# Patient Record
Sex: Female | Born: 1955 | Race: Black or African American | Hispanic: No | Marital: Married | Smoking: Never smoker
Health system: Southern US, Community
[De-identification: ages and names within clinical notes are randomized; demographics above are authoritative.]

## PROBLEM LIST (undated history)

## (undated) DIAGNOSIS — E785 Hyperlipidemia, unspecified: Secondary | ICD-10-CM

## (undated) DIAGNOSIS — G8929 Other chronic pain: Principal | ICD-10-CM

## (undated) DIAGNOSIS — M1990 Unspecified osteoarthritis, unspecified site: Secondary | ICD-10-CM

## (undated) DIAGNOSIS — T7840XA Allergy, unspecified, initial encounter: Secondary | ICD-10-CM

## (undated) DIAGNOSIS — G43909 Migraine, unspecified, not intractable, without status migrainosus: Secondary | ICD-10-CM

## (undated) DIAGNOSIS — H269 Unspecified cataract: Secondary | ICD-10-CM

## (undated) DIAGNOSIS — N201 Calculus of ureter: Secondary | ICD-10-CM

## (undated) DIAGNOSIS — E119 Type 2 diabetes mellitus without complications: Secondary | ICD-10-CM

## (undated) DIAGNOSIS — M12811 Other specific arthropathies, not elsewhere classified, right shoulder: Secondary | ICD-10-CM

## (undated) DIAGNOSIS — M545 Low back pain: Principal | ICD-10-CM

## (undated) DIAGNOSIS — I10 Essential (primary) hypertension: Secondary | ICD-10-CM

## (undated) HISTORY — PX: ROTATOR CUFF REPAIR: SHX139

## (undated) HISTORY — DX: Unspecified cataract: H26.9

## (undated) HISTORY — PX: UMBILICAL HERNIA REPAIR: SHX196

## (undated) HISTORY — DX: Hyperlipidemia, unspecified: E78.5

## (undated) HISTORY — DX: Allergy, unspecified, initial encounter: T78.40XA

## (undated) HISTORY — DX: Other specific arthropathies, not elsewhere classified, right shoulder: M12.811

## (undated) HISTORY — DX: Low back pain: M54.5

## (undated) HISTORY — PX: CYST REMOVAL HAND: SHX6279

## (undated) HISTORY — DX: Other chronic pain: G89.29

## (undated) HISTORY — PX: TONSILLECTOMY: SUR1361

## (undated) HISTORY — DX: Unspecified osteoarthritis, unspecified site: M19.90

---

## 2000-03-18 HISTORY — PX: ABDOMINAL HYSTERECTOMY: SHX81

## 2000-03-28 ENCOUNTER — Other Ambulatory Visit: Admission: RE | Admit: 2000-03-28 | Discharge: 2000-03-28 | Payer: Self-pay | Admitting: Obstetrics and Gynecology

## 2001-01-27 ENCOUNTER — Encounter: Payer: Self-pay | Admitting: Internal Medicine

## 2001-01-27 ENCOUNTER — Ambulatory Visit (HOSPITAL_COMMUNITY): Admission: RE | Admit: 2001-01-27 | Discharge: 2001-01-27 | Payer: Self-pay | Admitting: Internal Medicine

## 2001-03-17 ENCOUNTER — Ambulatory Visit (HOSPITAL_COMMUNITY): Admission: RE | Admit: 2001-03-17 | Discharge: 2001-03-17 | Payer: Self-pay | Admitting: Internal Medicine

## 2001-03-17 ENCOUNTER — Encounter: Payer: Self-pay | Admitting: Obstetrics and Gynecology

## 2003-01-07 ENCOUNTER — Encounter: Payer: Self-pay | Admitting: Obstetrics and Gynecology

## 2003-01-07 ENCOUNTER — Ambulatory Visit (HOSPITAL_COMMUNITY): Admission: RE | Admit: 2003-01-07 | Discharge: 2003-01-07 | Payer: Self-pay | Admitting: Obstetrics and Gynecology

## 2003-07-26 ENCOUNTER — Ambulatory Visit (HOSPITAL_COMMUNITY): Admission: RE | Admit: 2003-07-26 | Discharge: 2003-07-26 | Payer: Self-pay | Admitting: Internal Medicine

## 2004-03-13 ENCOUNTER — Ambulatory Visit (HOSPITAL_COMMUNITY): Admission: RE | Admit: 2004-03-13 | Discharge: 2004-03-13 | Payer: Self-pay | Admitting: Internal Medicine

## 2004-03-16 ENCOUNTER — Ambulatory Visit (HOSPITAL_COMMUNITY): Admission: RE | Admit: 2004-03-16 | Discharge: 2004-03-16 | Payer: Self-pay | Admitting: Obstetrics and Gynecology

## 2005-08-02 ENCOUNTER — Ambulatory Visit (HOSPITAL_COMMUNITY): Admission: RE | Admit: 2005-08-02 | Discharge: 2005-08-02 | Payer: Self-pay | Admitting: Obstetrics and Gynecology

## 2006-03-17 ENCOUNTER — Ambulatory Visit (HOSPITAL_COMMUNITY): Admission: RE | Admit: 2006-03-17 | Discharge: 2006-03-17 | Payer: Self-pay | Admitting: Internal Medicine

## 2007-10-23 ENCOUNTER — Ambulatory Visit (HOSPITAL_COMMUNITY): Admission: RE | Admit: 2007-10-23 | Discharge: 2007-10-23 | Payer: Self-pay | Admitting: Internal Medicine

## 2008-02-10 ENCOUNTER — Ambulatory Visit (HOSPITAL_COMMUNITY): Admission: RE | Admit: 2008-02-10 | Discharge: 2008-02-10 | Payer: Self-pay | Admitting: Obstetrics and Gynecology

## 2010-04-11 ENCOUNTER — Encounter: Payer: Self-pay | Admitting: Gastroenterology

## 2010-04-19 NOTE — Letter (Signed)
Summary: Colonoscopy Letter  [facility]  [address] [place], [place] [address]   Phone: [phone]  Fax: [phone]      April 11, 2010 MRN: [id number]   [facility] [address] [place], [place]  [address]   Dear Ms. MILLER,   According to your medical record, it is time for you to schedule a Colonoscopy. The American Cancer Society recommends this procedure as a method to detect early colon cancer. Patients with a family history of colon cancer, or a personal history of colon polyps or inflammatory bowel disease are at increased risk.  This letter has been generated based on the recommendations made at the time of your procedure. If you feel that in your particular situation this may no longer apply, please contact our office.  Please call our office at [phone] to schedule this appointment or to update your records at your earliest convenience.  Thank you for cooperating with [place] to provide you with the very best care possible.   Sincerely,  Judie Petit T. Russella Dar, M.D.  [facility] [phone]

## 2011-12-02 ENCOUNTER — Encounter: Payer: Self-pay | Admitting: Gastroenterology

## 2012-08-09 ENCOUNTER — Emergency Department (HOSPITAL_COMMUNITY)
Admission: EM | Admit: 2012-08-09 | Discharge: 2012-08-09 | Disposition: A | Discharge status: Home / Self Care | Payer: BC Managed Care – PPO | Attending: Emergency Medicine | Admitting: Emergency Medicine

## 2012-08-09 ENCOUNTER — Emergency Department (HOSPITAL_COMMUNITY): Payer: BC Managed Care – PPO

## 2012-08-09 ENCOUNTER — Encounter (HOSPITAL_COMMUNITY): Payer: Self-pay | Admitting: Emergency Medicine

## 2012-08-09 DIAGNOSIS — R062 Wheezing: Secondary | ICD-10-CM | POA: Insufficient documentation

## 2012-08-09 DIAGNOSIS — J40 Bronchitis, not specified as acute or chronic: Secondary | ICD-10-CM

## 2012-08-09 DIAGNOSIS — J029 Acute pharyngitis, unspecified: Secondary | ICD-10-CM | POA: Insufficient documentation

## 2012-08-09 DIAGNOSIS — J3489 Other specified disorders of nose and nasal sinuses: Secondary | ICD-10-CM | POA: Insufficient documentation

## 2012-08-09 DIAGNOSIS — R6883 Chills (without fever): Secondary | ICD-10-CM | POA: Insufficient documentation

## 2012-08-09 DIAGNOSIS — Z8679 Personal history of other diseases of the circulatory system: Secondary | ICD-10-CM | POA: Insufficient documentation

## 2012-08-09 DIAGNOSIS — R6889 Other general symptoms and signs: Secondary | ICD-10-CM | POA: Insufficient documentation

## 2012-08-09 DIAGNOSIS — I10 Essential (primary) hypertension: Secondary | ICD-10-CM | POA: Insufficient documentation

## 2012-08-09 DIAGNOSIS — G479 Sleep disorder, unspecified: Secondary | ICD-10-CM | POA: Insufficient documentation

## 2012-08-09 DIAGNOSIS — J209 Acute bronchitis, unspecified: Secondary | ICD-10-CM | POA: Insufficient documentation

## 2012-08-09 HISTORY — DX: Migraine, unspecified, not intractable, without status migrainosus: G43.909

## 2012-08-09 HISTORY — DX: Essential (primary) hypertension: I10

## 2012-08-09 MED ORDER — ALBUTEROL SULFATE HFA 108 (90 BASE) MCG/ACT IN AERS
2.0000 | INHALATION_SPRAY | RESPIRATORY_TRACT | Status: DC | PRN
  Administered 2012-08-09: 2 via RESPIRATORY_TRACT
  Filled 2012-08-09: qty 6.7

## 2012-08-09 MED ORDER — GUAIFENESIN-CODEINE 100-10 MG/5ML PO SYRP: 5.0000 mL | ORAL_SOLUTION | Freq: Three times a day (TID) | ORAL | Status: DC | PRN

## 2012-08-09 MED ORDER — AZITHROMYCIN 250 MG PO TABS: 250.0000 mg | ORAL_TABLET | Freq: Every day | ORAL | Status: DC

## 2012-08-09 NOTE — ED Notes (Signed)
C/o scratchy throat x 5 days ago. Then started coughing and c/o wheezing. Denies prod cough. Nad. No resp distres noted. Dry cough noted.

## 2012-08-09 NOTE — ED Provider Notes (Signed)
History     CSN: [id number]  Arrival date & time 08/09/12  0755   First MD Initiated Contact with Patient 08/09/12 [phone]      Chief Complaint  Patient presents with  . Cough    (Consider location/radiation/quality/duration/timing/severity/associated sxs/prior treatment) Patient is a 57 y.o. female presenting with cough. The history is provided by the patient.  Cough Cough characteristics:  Non-productive and hacking Duration:  5 days Timing:  Sporadic Progression:  Worsening Chronicity:  New Smoker: no   Relieved by:  Nothing Worsened by:  Lying down Ineffective treatments:  Decongestant Associated symptoms: chills, sore throat and wheezing   Associated symptoms: no chest pain, no fever, no headaches, no myalgias and no rash    Glenda White is a 57 y.o. female who presents to the ED with a cough and scratchy throat. She states the symptoms are worse at night and she has trouble sleeping. She feel like she is wheezing. She has tried OTC medication without relief. Thought at first it was allergies but symptoms have continued despite medications.   Past Medical History  Diagnosis Date  . Hypertension   . Migraines     Past Surgical History  Procedure Laterality Date  . Abdominal hysterectomy    . Hernia repair    . Cyst removal hand    . Rotator cuff repair Left     History reviewed. No pertinent family history.  History  Substance Use Topics  . Smoking status: Never Smoker   . Smokeless tobacco: Not on file  . Alcohol Use: No    OB History   Grav Para Term Preterm Abortions TAB SAB Ect Mult Living                  Review of Systems  Constitutional: Positive for chills. Negative for fever.  HENT: Positive for sore throat.   Respiratory: Positive for cough and wheezing.   Cardiovascular: Negative for chest pain.  Gastrointestinal: Negative for nausea, vomiting, abdominal pain and diarrhea.  Musculoskeletal: Negative for myalgias.  Skin: Negative for  rash.  Neurological: Negative for light-headedness and headaches.  Psychiatric/Behavioral: The patient is not nervous/anxious.     Allergies  Review of patient's allergies indicates no known allergies.  Home Medications  No current outpatient prescriptions on file.  BP 125/67  Pulse 89  Temp(Src) 98.2 F (36.8 C) (Oral)  Resp 16  SpO2 98%  Physical Exam  Nursing note and vitals reviewed. Constitutional: She is oriented to person, place, and time. She appears well-developed and well-nourished. No distress.  HENT:  Head: Normocephalic.  Right Ear: Tympanic membrane normal.  Left Ear: Tympanic membrane normal.  Nose: Rhinorrhea present.  Mouth/Throat: Uvula is midline and mucous membranes are normal. Posterior oropharyngeal erythema present.  Eyes: EOM are normal.  Neck: Neck supple.  Cardiovascular: Normal rate and regular rhythm.   Pulmonary/Chest: Effort normal. She has decreased breath sounds in the right lower field. Wheezes: occasional.  Prolonged expirations  Abdominal: Soft. Bowel sounds are normal. There is no tenderness.  Musculoskeletal: Normal range of motion.  Neurological: She is alert and oriented to person, place, and time. No cranial nerve deficit.  Skin: Skin is warm and dry.  Psychiatric: She has a normal mood and affect. Her behavior is normal. Judgment and thought content normal.    ED Course  Procedures (including critical care time) Dg Chest 2 View  08/09/2012   *RADIOLOGY REPORT*  Clinical Data: Cough, congestion, sore throat  CHEST - 2 VIEW  Comparison: 10/23/2007; 03/17/2006  Findings: Grossly unchanged borderline enlarged cardiac silhouette. Normal mediastinal contours.  No focal parenchymal opacities.  No pleural effusion or pneumothorax.  No evidence of edema.  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  IMPRESSION: No acute cardiopulmonary disease.  Specifically, no evidence of pneumonia.   Original Report Authenticated By: Tacey Ruiz, MD   MDM  57 y.o. female with cough and wheezing. Will treat symptoms and she will follow up with Dr. Ouida Sills. Patient stable for discharge home without any immediate complications. No respiratory distress. I have reviewed this patient's vital signs, nurses notes, appropriate labs and imaging.  I have discussed findings and plan of care with the patient and she voices understanding.    Medication List    TAKE these medications       azithromycin 250 MG tablet  Commonly known as:  ZITHROMAX  Take 1 tablet (250 mg total) by mouth daily. Take first 2 tablets together, then 1 every day until finished.     guaiFENesin-codeine 100-10 MG/5ML syrup  Commonly known as:  ROBITUSSIN AC  Take 5 mLs by mouth 3 (three) times daily as needed for cough.               [address] [place], [place] 08/09/12 [phone]

## 2012-08-09 NOTE — ED Provider Notes (Signed)
Medical screening examination/treatment/procedure(s) were performed by non-physician practitioner and as supervising physician I was immediately available for consultation/collaboration.   Dione Booze, MD 08/09/12 [phone]

## 2012-11-27 ENCOUNTER — Ambulatory Visit (HOSPITAL_COMMUNITY)
Admission: RE | Admit: 2012-11-27 | Discharge: 2012-11-27 | Disposition: A | Discharge status: Home / Self Care | Payer: BC Managed Care – PPO | Source: Ambulatory Visit | Attending: Internal Medicine | Admitting: Internal Medicine

## 2012-11-27 ENCOUNTER — Other Ambulatory Visit (HOSPITAL_COMMUNITY): Payer: Self-pay | Admitting: Internal Medicine

## 2012-11-27 DIAGNOSIS — M545 Low back pain: Secondary | ICD-10-CM

## 2012-11-27 DIAGNOSIS — R0781 Pleurodynia: Secondary | ICD-10-CM

## 2012-11-27 DIAGNOSIS — M5459 Other low back pain: Secondary | ICD-10-CM

## 2012-11-27 DIAGNOSIS — R079 Chest pain, unspecified: Secondary | ICD-10-CM | POA: Insufficient documentation

## 2012-11-27 DIAGNOSIS — M549 Dorsalgia, unspecified: Secondary | ICD-10-CM | POA: Insufficient documentation

## 2012-11-27 DIAGNOSIS — IMO0002 Reserved for concepts with insufficient information to code with codable children: Secondary | ICD-10-CM | POA: Insufficient documentation

## 2013-05-06 ENCOUNTER — Other Ambulatory Visit (HOSPITAL_COMMUNITY): Payer: Self-pay | Admitting: Obstetrics and Gynecology

## 2013-05-06 DIAGNOSIS — Z1231 Encounter for screening mammogram for malignant neoplasm of breast: Secondary | ICD-10-CM

## 2013-05-07 ENCOUNTER — Ambulatory Visit (HOSPITAL_COMMUNITY)
Admission: RE | Admit: 2013-05-07 | Discharge: 2013-05-07 | Disposition: A | Discharge status: Home / Self Care | Payer: BC Managed Care – PPO | Source: Ambulatory Visit | Attending: Obstetrics and Gynecology | Admitting: Obstetrics and Gynecology

## 2013-05-07 DIAGNOSIS — Z1231 Encounter for screening mammogram for malignant neoplasm of breast: Secondary | ICD-10-CM | POA: Insufficient documentation

## 2013-05-14 ENCOUNTER — Encounter (HOSPITAL_COMMUNITY): Payer: Self-pay | Admitting: Emergency Medicine

## 2013-05-14 ENCOUNTER — Emergency Department (HOSPITAL_COMMUNITY): Payer: BC Managed Care – PPO

## 2013-05-14 ENCOUNTER — Emergency Department (HOSPITAL_COMMUNITY)
Admission: EM | Admit: 2013-05-14 | Discharge: 2013-05-14 | Disposition: A | Discharge status: Home / Self Care | Payer: BC Managed Care – PPO | Attending: Emergency Medicine | Admitting: Emergency Medicine

## 2013-05-14 DIAGNOSIS — Z792 Long term (current) use of antibiotics: Secondary | ICD-10-CM | POA: Insufficient documentation

## 2013-05-14 DIAGNOSIS — M25451 Effusion, right hip: Secondary | ICD-10-CM

## 2013-05-14 DIAGNOSIS — M25469 Effusion, unspecified knee: Secondary | ICD-10-CM | POA: Insufficient documentation

## 2013-05-14 DIAGNOSIS — I10 Essential (primary) hypertension: Secondary | ICD-10-CM | POA: Insufficient documentation

## 2013-05-14 DIAGNOSIS — M25569 Pain in unspecified knee: Secondary | ICD-10-CM | POA: Insufficient documentation

## 2013-05-14 DIAGNOSIS — M1611 Unilateral primary osteoarthritis, right hip: Secondary | ICD-10-CM

## 2013-05-14 LAB — CBC WITH DIFFERENTIAL/PLATELET
Basophils Absolute: 0 10*3/uL (ref 0.0–0.1)
Basophils Relative: 0 % (ref 0–1)
Eosinophils Absolute: 0.3 10*3/uL (ref 0.0–0.7)
Eosinophils Relative: 4 % (ref 0–5)
HCT: 40.5 % (ref 36.0–46.0)
Hemoglobin: 13.6 g/dL (ref 12.0–15.0)
Lymphocytes Relative: 25 % (ref 12–46)
Lymphs Abs: 2.1 10*3/uL (ref 0.7–4.0)
MCH: 30.8 pg (ref 26.0–34.0)
MCHC: 33.6 g/dL (ref 30.0–36.0)
MCV: 91.8 fL (ref 78.0–100.0)
Monocytes Absolute: 0.5 10*3/uL (ref 0.1–1.0)
Monocytes Relative: 6 % (ref 3–12)
Neutro Abs: 5.4 10*3/uL (ref 1.7–7.7)
Neutrophils Relative %: 65 % (ref 43–77)
Platelets: 181 10*3/uL (ref 150–400)
RBC: 4.41 MIL/uL (ref 3.87–5.11)
RDW: 14.6 % (ref 11.5–15.5)
WBC: 8.2 10*3/uL (ref 4.0–10.5)

## 2013-05-14 LAB — BASIC METABOLIC PANEL
BUN: 16 mg/dL (ref 6–23)
CO2: 24 mEq/L (ref 19–32)
Calcium: 8.6 mg/dL (ref 8.4–10.5)
Chloride: 105 mEq/L (ref 96–112)
Creatinine, Ser: 0.96 mg/dL (ref 0.50–1.10)
GFR calc Af Amer: 74 mL/min — ABNORMAL LOW (ref >90)
GFR calc non Af Amer: 64 mL/min — ABNORMAL LOW (ref >90)
Glucose, Bld: 115 mg/dL — ABNORMAL HIGH (ref 70–99)
Potassium: 3.1 mEq/L — ABNORMAL LOW (ref 3.7–5.3)
Sodium: 141 mEq/L (ref 137–147)

## 2013-05-14 LAB — D-DIMER, QUANTITATIVE: D-Dimer, Quant: 0.31 ug/mL-FEU (ref 0.00–0.48)

## 2013-05-14 MED ORDER — PREDNISONE 50 MG PO TABS
60.0000 mg | ORAL_TABLET | Freq: Once | ORAL | Status: AC
  Administered 2013-05-14: 60 mg via ORAL
  Filled 2013-05-14 (×2): qty 1

## 2013-05-14 MED ORDER — OXYCODONE-ACETAMINOPHEN 5-325 MG PO TABS: 1.0000 | ORAL_TABLET | ORAL | Status: DC | PRN

## 2013-05-14 MED ORDER — POTASSIUM CHLORIDE CRYS ER 20 MEQ PO TBCR
40.0000 meq | EXTENDED_RELEASE_TABLET | Freq: Once | ORAL | Status: AC
  Administered 2013-05-14: 40 meq via ORAL
  Filled 2013-05-14: qty 2

## 2013-05-14 MED ORDER — HYDROMORPHONE HCL PF 1 MG/ML IJ SOLN
1.0000 mg | Freq: Once | INTRAMUSCULAR | Status: AC
  Administered 2013-05-14: 1 mg via INTRAVENOUS
  Filled 2013-05-14: qty 1

## 2013-05-14 MED ORDER — PREDNISONE 10 MG PO TABS: 60.0000 mg | ORAL_TABLET | Freq: Every day | ORAL | Status: DC

## 2013-05-14 MED ORDER — ONDANSETRON HCL 4 MG/2ML IJ SOLN
4.0000 mg | Freq: Once | INTRAMUSCULAR | Status: AC
  Administered 2013-05-14: 4 mg via INTRAVENOUS
  Filled 2013-05-14: qty 2

## 2013-05-14 MED ORDER — OXYCODONE-ACETAMINOPHEN 5-325 MG PO TABS
1.0000 | ORAL_TABLET | Freq: Once | ORAL | Status: AC
  Administered 2013-05-14: 1 via ORAL
  Filled 2013-05-14: qty 1

## 2013-05-14 MED ORDER — POTASSIUM CHLORIDE 10 MEQ/100ML IV SOLN
10.0000 meq | Freq: Once | INTRAVENOUS | Status: AC
  Administered 2013-05-14: 10 meq via INTRAVENOUS
  Filled 2013-05-14: qty 100

## 2013-05-14 MED ORDER — IBUPROFEN 600 MG PO TABS: 600.0000 mg | ORAL_TABLET | Freq: Three times a day (TID) | ORAL | Status: DC | PRN

## 2013-05-14 NOTE — ED Provider Notes (Signed)
CSN: [id number]     Arrival date & time 05/14/13  0445 History   First MD Initiated Contact with Patient 05/14/13 [phone]     Chief Complaint  Patient presents with  . Groin Pain     (Consider location/radiation/quality/duration/timing/severity/associated sxs/prior Treatment) The history is provided by the patient.   58 year old female had onset last evening of pain in the proximal right thigh. Pain is worse with movement or weightbearing. She states it is sharp and severe and she rates at 10/10. She denies any trauma, and denies any unusual activity recently. She tried applying heat and muscle cream with no relief. She has not had pain like this before. She denies numbness or tingling. Pain does not radiate to the abdomen or back.  Past Medical History  Diagnosis Date  . Hypertension   . Migraines    Past Surgical History  Procedure Laterality Date  . Abdominal hysterectomy    . Hernia repair    . Cyst removal hand    . Rotator cuff repair Left    No family history on file. History  Substance Use Topics  . Smoking status: Never Smoker   . Smokeless tobacco: Not on file  . Alcohol Use: No   OB History   Grav Para Term Preterm Abortions TAB SAB Ect Mult Living                 Review of Systems  All other systems reviewed and are negative.      Allergies  Review of patient's allergies indicates no known allergies.  Home Medications   Current Outpatient Rx  Name  Route  Sig  Dispense  Refill  . azithromycin (ZITHROMAX) 250 MG tablet   Oral   Take 1 tablet (250 mg total) by mouth daily. Take first 2 tablets together, then 1 every day until finished.   6 tablet   0   . guaiFENesin-codeine (ROBITUSSIN AC) 100-10 MG/5ML syrup   Oral   Take 5 mLs by mouth 3 (three) times daily as needed for cough.   120 mL   0    BP 118/71  Pulse 85  Temp(Src) 98.1 F (36.7 C) (Oral)  Resp 16  Ht 5' (1.524 m)  Wt 225 lb (102.059 kg)  BMI 43.94 kg/m2  SpO2 98% Physical  Exam  Nursing note and vitals reviewed.  58 year old female, resting comfortably and in no acute distress. Vital signs are normal. Oxygen saturation is 98%, which is normal. Head is normocephalic and atraumatic. PERRLA, EOMI. Oropharynx is clear. Neck is nontender and supple without adenopathy or JVD. Back is nontender and there is no CVA tenderness. Lungs are clear without rales, wheezes, or rhonchi. Chest is nontender. Heart has regular rate and rhythm without murmur. Abdomen is soft, flat, nontender without masses or hepatosplenomegaly and peristalsis is normoactive. Extremities : There is no swelling or deformity of the right thigh. There is tenderness to palpation anteriorly and laterally on the proximal thigh and hip. There is severe pain with external rotation and flexion. Distal neurovascular exam is intact with strong pulses, prompt capillary refill, normal sensation. No other extremity abnormalities are seen. Skin is warm and dry without rash. Neurologic: Mental status is normal, cranial nerves are intact, there are no motor or sensory deficits.  ED Course  Procedures (including critical care time) Labs Review Results for orders placed during the hospital encounter of 05/14/13  CBC WITH DIFFERENTIAL      Result Value Ref Range  WBC 8.2  4.0 - 10.5 K/uL   RBC 4.41  3.87 - 5.11 MIL/uL   Hemoglobin 13.6  12.0 - 15.0 g/dL   HCT 91.4  78.2 - 95.6 %   MCV 91.8  78.0 - 100.0 fL   MCH 30.8  26.0 - 34.0 pg   MCHC 33.6  30.0 - 36.0 g/dL   RDW 21.3  08.6 - 57.8 %   Platelets 181  150 - 400 K/uL   Neutrophils Relative % 65  43 - 77 %   Neutro Abs 5.4  1.7 - 7.7 K/uL   Lymphocytes Relative 25  12 - 46 %   Lymphs Abs 2.1  0.7 - 4.0 K/uL   Monocytes Relative 6  3 - 12 %   Monocytes Absolute 0.5  0.1 - 1.0 K/uL   Eosinophils Relative 4  0 - 5 %   Eosinophils Absolute 0.3  0.0 - 0.7 K/uL   Basophils Relative 0  0 - 1 %   Basophils Absolute 0.0  0.0 - 0.1 K/uL  BASIC METABOLIC PANEL       Result Value Ref Range   Sodium 141  137 - 147 mEq/L   Potassium 3.1 (*) 3.7 - 5.3 mEq/L   Chloride 105  96 - 112 mEq/L   CO2 24  19 - 32 mEq/L   Glucose, Bld 115 (*) 70 - 99 mg/dL   BUN 16  6 - 23 mg/dL   Creatinine, Ser 4.69  0.50 - 1.10 mg/dL   Calcium 8.6  8.4 - 62.9 mg/dL   GFR calc non Af Amer 64 (*) >90 mL/min   GFR calc Af Amer 74 (*) >90 mL/min  D-DIMER, QUANTITATIVE      Result Value Ref Range   D-Dimer, Quant 0.31  0.00 - 0.48 ug/mL-FEU   Imaging Review Dg Hip Complete Right  05/14/2013   CLINICAL DATA:  Right hip pain, with pain on bearing weight.  EXAM: RIGHT HIP - COMPLETE 2+ VIEW  COMPARISON:  Right chest radiographs performed 03/13/2004  FINDINGS: There is no evidence of fracture or dislocation. Both femoral heads are seated normally within their respective acetabula. The proximal right femur appears intact. No significant degenerative change is appreciated. The sacroiliac joints are unremarkable in appearance.  The visualized bowel gas pattern is grossly unremarkable in appearance.  IMPRESSION: No evidence of fracture or dislocation.   Electronically Signed   By: Roanna Raider M.D.   On: 05/14/2013 05:50   Ct Hip Right Wo Contrast  05/14/2013   CLINICAL DATA:  Severe pain with standing.  EXAM: CT OF THE RIGHT HIP WITHOUT CONTRAST  TECHNIQUE: Multidetector CT imaging was performed according to the standard protocol. Multiplanar CT image reconstructions were also generated.  COMPARISON:  Radiography same day and 03/13/2004  FINDINGS: There is mild joint space narrowing. There are circumferential acetabular osteophytes. The osteophyte is discontinuous along the superior anterior corner our. This is often seen as a chronic characteristic of the osteophytes, but 1 could not rule out an osteophytic fracture. I do not think that is particularly likely. MRI could assess for marrow edema in that region. Surrounding soft tissues do not show any acute finding. There is no visible  joint effusion. Other bones of the right hemipelvis are unremarkable.  IMPRESSION: Probably, there is no acute bony finding. 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 There is some discontinuity of the osteophyte along the anterior superior margin. This can be seen chronically with acetabular osteophytes.  However, with CT, one cannot completely exclude the possibility of an acetabular osteophyte fracture. MRI would be able to do that if there is concern about that possibility. Otherwise, could the pain relate to the osteoarthritis?   Electronically Signed   By: Paulina Fusi M.D.   On: 05/14/2013 07:31   Images viewed by me.  MDM   Final diagnoses:  Pain in right hip  Hypokalemia    Right thigh pain of uncertain cause. X-ray of the obtained and screening labs obtained. Old records are reviewed and there are no relevant past visits.  Workup is negative. D-dimer is obtained to rule out DVT and has come back normal. Hip x-ray appears normal. Incidental finding of hypokalemia is made in she is given both oral and intravenous potassium. Following intravenous hydromorphone, she got some reasonable relief of pain but as soon as she sat up, severe pain recurred. She was reexamined and continues to have severe pain with external rotation. She is afebrile and WBC is normal which would argue strongly against a septic joint. He did not have a good explanation for her pain. She'll be sent for CT scan.  CT shows questionable fracture of an osteophyte. She'll be sent for MRI to clarify this. She is still complaining of severe pain with any movement. Case is signed out to Dr. Patria Mane.  Dione Booze, MD 05/14/13 [phone]

## 2013-05-14 NOTE — ED Notes (Signed)
Pain in right groin area that is worse with any movement of weight bearing of right leg.  Pt denies straining or pulling a muscle

## 2013-05-14 NOTE — ED Provider Notes (Signed)
9:26 AM Pt feels better at this time. Afebrile. Normal WBC count. Arthritis of right hip. Small joint effusion of right hip. No recent procedures or dental cleanings. I spoke with her orthopedist Dr Hilda Lias who will see in the office in 4 days. He recommends prednisone and pain medicine. Pt given strict return precautions including developing fever or worsening pain. My suspicion for spontaneous septic joint is low at this time.   Dg Hip Complete Right  05/14/2013   CLINICAL DATA:  Right hip pain, with pain on bearing weight.  EXAM: RIGHT HIP - COMPLETE 2+ VIEW  COMPARISON:  Right chest radiographs performed 03/13/2004  FINDINGS: There is no evidence of fracture or dislocation. Both femoral heads are seated normally within their respective acetabula. The proximal right femur appears intact. No significant degenerative change is appreciated. The sacroiliac joints are unremarkable in appearance.  The visualized bowel gas pattern is grossly unremarkable in appearance.  IMPRESSION: No evidence of fracture or dislocation.   Electronically Signed   By: Roanna Raider M.D.   On: 05/14/2013 05:50   Ct Hip Right Wo Contrast  05/14/2013   CLINICAL DATA:  Severe pain with standing.  EXAM: CT OF THE RIGHT HIP WITHOUT CONTRAST  TECHNIQUE: Multidetector CT imaging was performed according to the standard protocol. Multiplanar CT image reconstructions were also generated.  COMPARISON:  Radiography same day and 03/13/2004  FINDINGS: There is mild joint space narrowing. There are circumferential acetabular osteophytes. The osteophyte is discontinuous along the superior anterior corner our. This is often seen as a chronic characteristic of the osteophytes, but 1 could not rule out an osteophytic fracture. I do not think that is particularly likely. MRI could assess for marrow edema in that region. Surrounding soft tissues do not show any acute finding. There is no visible joint effusion. Other bones of the right hemipelvis are  unremarkable.  IMPRESSION: Probably, there is no acute bony finding. 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 There is some discontinuity of the osteophyte along the anterior superior margin. This can be seen chronically with acetabular osteophytes. However, with CT, one cannot completely exclude the possibility of an acetabular osteophyte fracture. MRI would be able to do that if there is concern about that possibility. Otherwise, could the pain relate to the osteoarthritis?   Electronically Signed   By: Paulina Fusi M.D.   On: 05/14/2013 07:31   Mr Hip Right Wo Contrast  05/14/2013   CLINICAL DATA:  Severe right hip pain.  EXAM: MRI OF THE RIGHT HIP WITHOUT CONTRAST  TECHNIQUE: Multiplanar, multisequence MR imaging was performed. No intravenous contrast was administered.  COMPARISON:  CT scan and radiographs dated 05/14/2013  FINDINGS: There is a right hip joint effusion. There is mild to moderate osteoarthritis of the right hip with circumferential osteophytes on the acetabulum and minimal osteophytes on the femoral head. There is diffuse narrowing of the joint space. There is no fracture or mass lesion.  There are focal degenerative changes of the distal right gluteus medius and minimus tendons at the insertion on the greater trochanter.  There is no adenopathy. The muscles are symmetrical and normal in the pelvis and hips. Uterus has been removed. There is a 12 mm left labial Bartholin gland duct cyst.  IMPRESSION: Right hip joint effusion consistent with nonspecific synovitis.  Mild to moderate right hip arthritis.  No fracture or mass lesion.   Electronically Signed   By: Geanie Cooley M.D.   On: 05/14/2013 08:49  Mm Digital Screening Bilateral  05/10/2013   CLINICAL DATA:  Screening.  EXAM: DIGITAL SCREENING BILATERAL MAMMOGRAM WITH CAD  COMPARISON:  Previous exam(s).  ACR Breast Density Category c: The breast tissue is heterogeneously dense, which may  obscure small masses.  FINDINGS: There are no findings suspicious for malignancy. Images were processed with CAD.  IMPRESSION: No mammographic evidence of malignancy. A result letter of this screening mammogram will be mailed directly to the patient.  RECOMMENDATION: Screening mammogram in one year. (Code:SM-B-01Y)  BI-RADS CATEGORY  1: Negative.   Electronically Signed   By: Sherian ReinWei-Chen  Lin M.D.   On: 05/10/2013 08:06    Lyanne CoKevin M Neenah Canter, MD 05/14/13 [phone]

## 2013-05-14 NOTE — ED Notes (Signed)
Pt alert & oriented x4, stable gait. Patient given discharge instructions, paperwork & prescription(s). Patient  instructed to stop at the registration desk to finish any additional paperwork. Patient verbalized understanding. Pt left department w/ no further questions. 

## 2014-08-31 ENCOUNTER — Other Ambulatory Visit (HOSPITAL_COMMUNITY): Payer: Self-pay | Admitting: Internal Medicine

## 2014-08-31 DIAGNOSIS — M5416 Radiculopathy, lumbar region: Secondary | ICD-10-CM

## 2014-09-02 ENCOUNTER — Ambulatory Visit
Admission: RE | Admit: 2014-09-02 | Discharge: 2014-09-02 | Disposition: A | Discharge status: Home / Self Care | Payer: No Typology Code available for payment source | Source: Ambulatory Visit | Attending: Internal Medicine | Admitting: Internal Medicine

## 2014-09-02 ENCOUNTER — Ambulatory Visit (HOSPITAL_COMMUNITY): Payer: Self-pay

## 2014-09-02 DIAGNOSIS — M5416 Radiculopathy, lumbar region: Secondary | ICD-10-CM

## 2014-09-16 ENCOUNTER — Ambulatory Visit (INDEPENDENT_AMBULATORY_CARE_PROVIDER_SITE_OTHER): Payer: Self-pay | Admitting: Neurology

## 2014-09-16 ENCOUNTER — Encounter: Payer: Self-pay | Admitting: Neurology

## 2014-09-16 VITALS — BP 124/73 | HR 71 | Ht 60.0 in | Wt 222.0 lb

## 2014-09-16 DIAGNOSIS — G8929 Other chronic pain: Secondary | ICD-10-CM | POA: Insufficient documentation

## 2014-09-16 DIAGNOSIS — M545 Low back pain, unspecified: Secondary | ICD-10-CM

## 2014-09-16 DIAGNOSIS — G43009 Migraine without aura, not intractable, without status migrainosus: Secondary | ICD-10-CM | POA: Insufficient documentation

## 2014-09-16 HISTORY — DX: Low back pain, unspecified: M54.50

## 2014-09-16 MED ORDER — GABAPENTIN 100 MG PO CAPS: ORAL_CAPSULE | ORAL | Status: DC

## 2014-09-16 NOTE — Patient Instructions (Addendum)
   We will start a medication called gabapentin for the low back pain, if the medication is well tolerated, but not effective, please call our office and we will increase the dose. We will get she set up for an epidural steroid injection for the back. If this is helpful, this can be repeated in the future. Seeking out chiropractic therapy may be of some benefit. In the long run, weight loss will help the back pain.  Back Exercises Back exercises help treat and prevent back injuries. The goal is to increase your strength in your belly (abdominal) and back muscles. These exercises can also help with flexibility. Start these exercises when told by your doctor. HOME CARE Back exercises include: Pelvic Tilt.  Lie on your back with your knees bent. Tilt your pelvis until the lower part of your back is against the floor. Hold this position 5 to 10 sec. Repeat this exercise 5 to 10 times. Knee to Chest.  Pull 1 knee up against your chest and hold for 20 to 30 seconds. Repeat this with the other knee. This may be done with the other leg straight or bent, whichever feels better. Then, pull both knees up against your chest. Sit-Ups or Curl-Ups.  Bend your knees 90 degrees. Start with tilting your pelvis, and do a partial, slow sit-up. Only lift your upper half 30 to 45 degrees off the floor. Take at least 2 to 3 seonds for each sit-up. Do not do sit-ups with your knees out straight. If partial sit-ups are difficult, simply do the above but with only tightening your belly (abdominal) muscles and holding it as told. Hip-Lift.  Lie on your back with your knees flexed 90 degrees. Push down with your feet and shoulders as you raise your hips 2 inches off the floor. Hold for 10 seconds, repeat 5 to 10 times. Back Arches.  Lie on your stomach. Prop yourself up on bent elbows. Slowly press on your hands, causing an arch in your low back. Repeat 3 to 5 times. Shoulder-Lifts.  Lie face down with arms beside your  body. Keep hips and belly pressed to floor as you slowly lift your head and shoulders off the floor. Do not overdo your exercises. Be careful in the beginning. Exercises may cause you some mild back discomfort. If the pain lasts for more than 15 minutes, stop the exercises until you see your doctor. Improvement with exercise for back problems is slow.  Document Released: 04/06/2010 Document Revised: 05/27/2011 Document Reviewed: 01/03/2011 [facility] Patient Information 2015 [place], [place]. This information is not intended to replace advice given to you by your health care provider. Make sure you discuss any questions you have with your health care provider.

## 2014-09-16 NOTE — Progress Notes (Signed)
Reason for visit: Chronic low back pain  Referring physician: Dr. Carylon Perchesoy Fagan  Glenda White is a 59 y.o. female  History of present illness:  Glenda White is a 59 year old right-handed black female with a history of chronic low back pain. The patient has had significant discomfort in her back over the last 2-3 years with pain in the midportion of the back, with some discomfort going down into the right leg to the knee area. The pain is worse when she is up on her feet, she has difficulty standing for long periods of time. She will feel better sitting or lying down, but the pain never completely goes away. She has been placed on Mobic with some benefit. The patient denies any true weakness of the lower extremities, and she denies any numbness of the legs. She has no pain in the neck or down the arms. She denies a particular issues with balance, no falls. She denies problems controlling the bowels or the bladder. Bending and stooping is difficult for her. She is sleeping fairly well at night, however. She has recently undergone MRI evaluation of the low back showing evidence of bilateral facet arthropathy at the L3-4 and L4-5 levels. Mild anterolisthesis is noted the L3-4 level. No definite nerve root compression was noted. She is sent to this office for further evaluation.  Past Medical History  Diagnosis Date  . Hypertension   . Migraines   . Chronic low back pain 09/16/2014  . Common migraine 09/16/2014  . Obese     Past Surgical History  Procedure Laterality Date  . Abdominal hysterectomy    . Hernia repair    . Cyst removal hand    . Rotator cuff repair Left   . Tonsillectomy    . Cesarean section      Family History  Problem Relation Age of Onset  . Hypertension Mother   . Cancer - Lung Mother   . Ovarian cancer Mother   . Hypertension Sister   . Diabetes Sister   . Hyperlipidemia Sister     Social history:  reports that she has never smoked. She has never used smokeless  tobacco. She reports that she does not drink alcohol or use illicit drugs.  Medications:  Prior to Admission medications   Medication Sig Start Date End Date Taking? Authorizing Provider  hydrochlorothiazide (HYDRODIURIL) 25 MG tablet Take 0.5 tablets by mouth daily. 03/16/13   Historical Provider, MD  NIFEDICAL XL 30 MG 24 hr tablet Take 1 tablet by mouth daily. 05/13/13   Historical Provider, MD  potassium chloride SA (K-DUR,KLOR-CON) 20 MEQ tablet Take 1 tablet by mouth daily. 05/05/13   Historical Provider, MD  topiramate (TOPAMAX) 100 MG tablet Take 1 tablet by mouth daily. 05/05/13   Historical Provider, MD     No Known Allergies  ROS:  Out of a complete 14 system review of symptoms, the patient complains only of the following symptoms, and all other reviewed systems are negative.  Chronic low back pain  Blood pressure 124/73, pulse 71, height 5' (1.524 m), weight 222 lb (100.699 kg).  Physical Exam  General: The patient is alert and cooperative at the time of the examination. The patient is moderately to markedly obese.  Eyes: Pupils are equal, round, and reactive to light. Discs are flat bilaterally.  Neck: The neck is supple, no carotid bruits are noted.  Respiratory: The respiratory examination is clear.  Cardiovascular: The cardiovascular examination reveals a regular rate and rhythm,  no obvious murmurs or rubs are noted.  Neuromuscular: Range of movement of the low back is full.  Skin: Extremities are without significant edema.  Neurologic Exam  Mental status: The patient is alert and oriented x 3 at the time of the examination. The patient has apparent normal recent and remote memory, with an apparently normal attention span and concentration ability.  Cranial nerves: Facial symmetry is present. There is good sensation of the face to pinprick and soft touch bilaterally. The strength of the facial muscles and the muscles to head turning and shoulder shrug are normal  bilaterally. Speech is well enunciated, no aphasia or dysarthria is noted. Extraocular movements are full. Visual fields are full. The tongue is midline, and the patient has symmetric elevation of the soft palate. No obvious hearing deficits are noted.  Motor: The motor testing reveals 5 over 5 strength of all 4 extremities. Good symmetric motor tone is noted throughout.  Sensory: Sensory testing is intact to pinprick, soft touch, vibration sensation, and position sense on all 4 extremities. No evidence of extinction is noted.  Coordination: Cerebellar testing reveals good finger-nose-finger and heel-to-shin bilaterally.  Gait and station: Gait is normal. Tandem gait is normal. Romberg is negative. No drift is seen.  Reflexes: Deep tendon reflexes are symmetric, but are depressed bilaterally. Toes are downgoing bilaterally.   Assessment/Plan:  1. Chronic low back pain  2. Obesity  The patient has back pain with some radiation down to the right knee. The patient likely does not have a radiculopathy, the pattern of pain is typical for facet joint arthritis. The patient will be placed on low-dose gabapentin, and she will be set up for an epidural steroid injection. The patient will follow-up in about 3 months. She will contact our office if she is not doing well. I have recommended considering chiropractic manipulations for improvement of pain. The patient may benefit from lumbar traction. I have recommended a weight loss program to improve low back discomfort in the future.  Marlan Palau MD 09/16/2014 5:45 PM  [facility] [address] [place], [place] [address]  Phone [phone] Fax [phone]

## 2014-09-20 ENCOUNTER — Other Ambulatory Visit: Payer: Self-pay | Admitting: Neurology

## 2014-09-20 DIAGNOSIS — G8929 Other chronic pain: Secondary | ICD-10-CM

## 2014-09-20 DIAGNOSIS — M545 Low back pain, unspecified: Secondary | ICD-10-CM

## 2014-09-27 ENCOUNTER — Ambulatory Visit
Admission: RE | Admit: 2014-09-27 | Discharge: 2014-09-27 | Disposition: A | Discharge status: Home / Self Care | Payer: PRIVATE HEALTH INSURANCE | Source: Ambulatory Visit | Attending: Neurology | Admitting: Neurology

## 2014-09-27 DIAGNOSIS — G8929 Other chronic pain: Secondary | ICD-10-CM

## 2014-09-27 DIAGNOSIS — M545 Low back pain, unspecified: Secondary | ICD-10-CM

## 2014-09-27 MED ORDER — METHYLPREDNISOLONE ACETATE 40 MG/ML INJ SUSP (RADIOLOG
120.0000 mg | Freq: Once | INTRAMUSCULAR | Status: AC
  Administered 2014-09-27: 120 mg via EPIDURAL

## 2014-09-27 MED ORDER — IOHEXOL 180 MG/ML  SOLN
1.0000 mL | Freq: Once | INTRAMUSCULAR | Status: AC | PRN
  Administered 2014-09-27: 1 mL via EPIDURAL

## 2014-09-27 NOTE — Discharge Instructions (Signed)

## 2014-12-21 ENCOUNTER — Ambulatory Visit (HOSPITAL_COMMUNITY): Payer: Self-pay | Attending: Internal Medicine | Admitting: Physical Therapy

## 2014-12-21 DIAGNOSIS — R198 Other specified symptoms and signs involving the digestive system and abdomen: Secondary | ICD-10-CM | POA: Insufficient documentation

## 2014-12-21 DIAGNOSIS — M25652 Stiffness of left hip, not elsewhere classified: Secondary | ICD-10-CM | POA: Insufficient documentation

## 2014-12-21 DIAGNOSIS — R29898 Other symptoms and signs involving the musculoskeletal system: Secondary | ICD-10-CM | POA: Insufficient documentation

## 2014-12-21 DIAGNOSIS — M545 Low back pain: Secondary | ICD-10-CM | POA: Insufficient documentation

## 2014-12-21 DIAGNOSIS — R6889 Other general symptoms and signs: Secondary | ICD-10-CM | POA: Insufficient documentation

## 2014-12-21 NOTE — Patient Instructions (Signed)
SINGLE KNEE TO CHEST STRETCH - SKTC  While Lying on your back,  hold your knee and gently pull it up towards your chest.   LOWER TRUNK ROTATIONS - LTR  Lying on your back with your knees bent, gently move your knees side-to-side. Pelvic tilt / transverse Abdominal  In supine with knees bent and feet flat on floor you want to activate your TRANSVERSE AB muscle- visualize tilting your pelvis back just slightly and drawing in your belly button toward your back bone. (basically like trying to zip up a tight pair of pants) You should feel the transverse Ab muscle pull in and away from your fingers. HOLD and repeat    BRIDGING  While lying on your back, tighten your lower abdominals, squeeze your buttocks and then raise your buttocks off the floor/bed as creating a "Bridge" with your body.

## 2014-12-21 NOTE — Therapy (Signed)
[facility] [address] [place], [place], [address] Phone: [phone]   Fax:  [phone]  Physical Therapy Evaluation  Patient Details  Name: Glenda White MRN: [id number] Date of Birth: 08-10-1955 Referring Provider:  Carylon Perches, MD  Encounter Date: 12/21/2014      PT End of Session - 12/21/14 1434    Visit Number 1   Number of Visits 10   Date for PT Re-Evaluation 01/20/15   Authorization Type Self pay   Authorization Time Period 12/21/14-02/15/15   PT Start Time 0845   PT Stop Time 0925   PT Time Calculation (min) 40 min   Activity Tolerance Patient tolerated treatment well   Behavior During Therapy [facility] for tasks assessed/performed      Past Medical History  Diagnosis Date  . Hypertension   . Migraines   . Chronic low back pain 09/16/2014  . Common migraine 09/16/2014  . Obese     Past Surgical History  Procedure Laterality Date  . Abdominal hysterectomy    . Hernia repair    . Cyst removal hand    . Rotator cuff repair Left   . Tonsillectomy    . Cesarean section      There were no vitals filed for this visit.  Visit Diagnosis:  Bilateral low back pain, with sciatica presence unspecified  Weakness of both legs  Abdominal weakness  Decreased functional activity tolerance  Stiffness of left hip joint      Subjective Assessment - 12/21/14 0851    Subjective Pt reports that she has had back and leg pain for the past 4-5 years, and it came on gradually. The pain was coming on mainly in the R side, and it would get so bad that she couldn't walk and she would have to go to the ED. Pt states that recently, the pain has been more in the L side of her back and LLE. Pt reports that the pain is worse when she is standing for a long time or doing housework that requires a lot of bending.    How long can you sit comfortably? no limitations- stiffness when she stands up   How long can you stand comfortably? 30-35  minutes   How long can you walk comfortably? 30-35 minutes   Patient Stated Goals Decrease pain, be able to walk without pain, decrease stiffness   Currently in Pain? Yes   Pain Score 4    Pain Location Back   Pain Orientation Right;Left;Lower   Pain Descriptors / Indicators Aching;Dull            [facility] PT Assessment - 12/21/14 0001    Assessment   Medical Diagnosis LBP   Next MD Visit 01/18/15   Prior Therapy no   Balance Screen   Has the patient fallen in the past 6 months No   Has the patient had a decrease in activity level because of a fear of falling?  No   Is the patient reluctant to leave their home because of a fear of falling?  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   Leisure reading, attending football games, cooking   Observation/Other Assessments   Focus on Therapeutic Outcomes (FOTO)  48%  limitation   ROM / Strength   AROM / PROM / Strength AROM;Strength   AROM   AROM Assessment Site Lumbar;Hip   Right/Left Hip Right;Left   Right Hip External Rotation  25   Right Hip Internal Rotation  24   Left Hip External Rotation  31   Left Hip Internal Rotation  15   Lumbar Flexion 73   Lumbar Extension 31   Lumbar - Right Side Bend 16   Lumbar - Left Side Bend 9   Strength   Strength Assessment Site Hip;Knee   Right/Left Hip Right;Left   Right Hip Flexion 3+/5   Right Hip Extension 3-/5   Right Hip ABduction 4/5   Left Hip Flexion 3/5   Left Hip Extension 3-/5   Left Hip ABduction 4/5   Right/Left Knee Right;Left   Right Knee Flexion 3+/5   Right Knee Extension 4+/5   Left Knee Flexion 4-/5   Left Knee Extension 4-/5   Flexibility   Soft Tissue Assessment /Muscle Length yes   Hamstrings SLR: L: 35 degrees with pain in knee    Transfers   Five time sit to stand comments  16.68"                   PT Education - 12/21/14 1433    Education provided Yes   Education Details HEP, prognosis   Person(s) Educated Patient   Methods Explanation;Handout   Comprehension Verbalized understanding          PT Short Term Goals - 12/21/14 1501    PT SHORT TERM GOAL #1   Title Pt will be independent with HEP.    Time 2   Period Weeks   Status New   PT SHORT TERM GOAL #2   Title Improve BLE strength to 4/5 or greater to improve gait mechanics.    Time 2   Period Weeks   Status New   PT SHORT TERM GOAL #3   Title Improve core strength to 4/5 to allow pt to stand for 45 minutes without pain.    Time 2   Period Weeks   Status New           PT Long Term Goals - 12/21/14 1504    PT LONG TERM GOAL #1   Title Pt will be independent with advanced HEP for core strengthening.    Time 4   Period Weeks   Status New   PT LONG TERM GOAL #2   Title Improve BLE strength to 4+/5 or greater to improve gait mechanics and functional mobility.    Time 4   Period Weeks   Status New   PT LONG TERM GOAL #3   Title Improve L hip IR to 25 degrees or greater to decrease pain and improve mobility.    Time 4   Period Weeks   Status New   PT LONG TERM GOAL #4   Title Complete five time sit to stand in 12 seconds or less to demonstrate improved BLE power and strength.   Time 4   Period Weeks   Status New   PT LONG TERM GOAL #5   Title Pt will stand for one hour without LBP or pain in legs to demonstrate improved core strength.    Time 4   Period Weeks   Status New               Plan - 12/21/14 1437    Clinical Impression Statement Pt presents  to PT with c/o long standing LBP that radiates into both legs. She demonstrates decreased lumbar ROM, decreased strength of RLE, decreased ROM of L hip, pain in L knee, and decreased functional activity tolerance. Pt will benefit from skilled physical therapy at this  time to improve her core strength, improve mobility of  lumbar spine, increase BLE strength, and improve functional activity tolerance to return pt to optimal level of function.    Pt will benefit from skilled therapeutic intervention in order to improve on the following deficits Abnormal gait;Decreased activity tolerance;Decreased endurance;Decreased mobility;Decreased range of motion;Decreased strength;Difficulty walking;Pain   Rehab Potential Good   PT Frequency 2x / week   PT Duration 4 weeks   PT Treatment/Interventions ADLs/Self Care Home Management;Moist Heat;Gait training;Stair training;Functional mobility training;Therapeutic activities;Therapeutic exercise;Balance training;Neuromuscular re-education;Patient/family education;Manual techniques   PT Next Visit Plan Review goals and HEP, continue with core strengthening         Problem List Patient Active Problem List   Diagnosis Date Noted  . Chronic low back pain 09/16/2014  . Common migraine 09/16/2014    Leona Singleton, PT, DPT [phone]  12/21/2014, 3:08 PM  [facility] [address] [place], [place], [address] Phone: [phone]   Fax:  [phone]

## 2014-12-23 ENCOUNTER — Ambulatory Visit (HOSPITAL_COMMUNITY): Payer: Self-pay | Admitting: Physical Therapy

## 2014-12-27 ENCOUNTER — Ambulatory Visit (HOSPITAL_COMMUNITY): Payer: Self-pay | Admitting: Physical Therapy

## 2014-12-27 DIAGNOSIS — M545 Low back pain: Secondary | ICD-10-CM

## 2014-12-27 DIAGNOSIS — R198 Other specified symptoms and signs involving the digestive system and abdomen: Secondary | ICD-10-CM

## 2014-12-27 DIAGNOSIS — R29898 Other symptoms and signs involving the musculoskeletal system: Secondary | ICD-10-CM

## 2014-12-27 DIAGNOSIS — R6889 Other general symptoms and signs: Secondary | ICD-10-CM

## 2014-12-27 NOTE — Therapy (Signed)
[facility] [address] [place], [place], [address] Phone: [phone]   Fax:  [phone]  Physical Therapy Treatment  Patient Details  Name: Glenda White MRN: [id number] Date of Birth: 01/30/1956 Referring Provider:  Trey Sailors, MD  Encounter Date: 12/27/2014      PT End of Session - 12/27/14 0928    Visit Number 2   Number of Visits 10   Date for PT Re-Evaluation 01/20/15   Authorization Type Self pay   Authorization Time Period 12/21/14-02/15/15   PT Start Time 0848   PT Stop Time 0932   PT Time Calculation (min) 44 min   Activity Tolerance Patient tolerated treatment well   Behavior During Therapy [facility] for tasks assessed/performed      Past Medical History  Diagnosis Date  . Hypertension   . Migraines   . Chronic low back pain 09/16/2014  . Common migraine 09/16/2014  . Obese     Past Surgical History  Procedure Laterality Date  . Abdominal hysterectomy    . Hernia repair    . Cyst removal hand    . Rotator cuff repair Left   . Tonsillectomy    . Cesarean section      There were no vitals filed for this visit.  Visit Diagnosis:  Bilateral low back pain, with sciatica presence unspecified  Weakness of both legs  Abdominal weakness  Decreased functional activity tolerance      Subjective Assessment - 12/27/14 0852    Subjective Pt reports that she feels really stiff today. She has been doing her HEP, but she states that she also feels stiff after doing them.    Currently in Pain? Yes   Pain Score 7    Pain Location Back                 [facility] Adult PT Treatment/Exercise - 12/27/14 0001    Exercises   Exercises Lumbar   Lumbar Exercises: Stretches   Active Hamstring Stretch 3 reps;30 seconds   Single Knee to Chest Stretch 5 reps;10 seconds   Lower Trunk Rotation Limitations 10 reps, 3 seconds   Prone on Elbows Stretch 3 reps;30 seconds   Lumbar Exercises: Aerobic   Stationary Bike Nustep  10' hills 3 level 2   Lumbar Exercises: Standing   Functional Squats 15 reps   Functional Squats Limitations at mat table   Forward Lunge 10 reps   Other Standing Lumbar Exercises hip extension with trunk flexed x 10 bilat   Lumbar Exercises: Supine   Ab Set 15 reps;3 seconds   Bent Knee Raise 10 reps   Bridge 15 reps   Lumbar Exercises: Sidelying   Clam 10 reps                PT Education - 12/27/14 0927    Education provided Yes   Education Details goals and HEP reviewed   Person(s) Educated Patient   Methods Explanation   Comprehension Verbalized understanding          PT Short Term Goals - 12/21/14 1501    PT SHORT TERM GOAL #1   Title Pt will be independent with HEP.    Time 2   Period Weeks   Status New   PT SHORT TERM GOAL #2   Title Improve BLE strength to 4/5 or greater to improve gait mechanics.    Time 2   Period Weeks   Status New   PT SHORT TERM GOAL #  3   Title Improve core strength to 4/5 to allow pt to stand for 45 minutes without pain.    Time 2   Period Weeks   Status New           PT Long Term Goals - 12/21/14 1504    PT LONG TERM GOAL #1   Title Pt will be independent with advanced HEP for core strengthening.    Time 4   Period Weeks   Status New   PT LONG TERM GOAL #2   Title Improve BLE strength to 4+/5 or greater to improve gait mechanics and functional mobility.    Time 4   Period Weeks   Status New   PT LONG TERM GOAL #3   Title Improve L hip IR to 25 degrees or greater to decrease pain and improve mobility.    Time 4   Period Weeks   Status New   PT LONG TERM GOAL #4   Title Complete five time sit to stand in 12 seconds or less to demonstrate improved BLE power and strength.   Time 4   Period Weeks   Status New   PT LONG TERM GOAL #5   Title Pt will stand for one hour without LBP or pain in legs to demonstrate improved core strength.    Time 4   Period Weeks   Status New               Plan - 12/27/14  8295    Clinical Impression Statement Goals and HEP reviewed with pt, core strengthening was progressed to include bent knee raise and sidelying clams. Pt reported that she had some slight increase in knee pain with bent knee raise, but  denied any increased LBP with any therex. Pt required verbal and tactile cueing with bent knee raise and abdominal sets to complete with proper form.    PT Next Visit Plan Continue with core strengthening and BLE strengthening        Problem List Patient Active Problem List   Diagnosis Date Noted  . Chronic low back pain 09/16/2014  . Common migraine 09/16/2014    Leona Singleton, PT, DPT [phone] 12/27/2014, 11:00 AM  [facility] [address] [place], [place], [address] Phone: [phone]   Fax:  [phone]

## 2014-12-29 ENCOUNTER — Ambulatory Visit (HOSPITAL_COMMUNITY): Payer: Self-pay

## 2014-12-29 DIAGNOSIS — M25652 Stiffness of left hip, not elsewhere classified: Secondary | ICD-10-CM

## 2014-12-29 DIAGNOSIS — R29898 Other symptoms and signs involving the musculoskeletal system: Secondary | ICD-10-CM

## 2014-12-29 DIAGNOSIS — M545 Low back pain: Secondary | ICD-10-CM

## 2014-12-29 DIAGNOSIS — R6889 Other general symptoms and signs: Secondary | ICD-10-CM

## 2014-12-29 DIAGNOSIS — R198 Other specified symptoms and signs involving the digestive system and abdomen: Secondary | ICD-10-CM

## 2014-12-29 NOTE — Therapy (Signed)
[place], [place], [address] Phone: [phone]   Fax:  [phone]  Physical Therapy Treatment  Patient Details  Name: Glenda White MRN: [id number] Date of Birth: [birth year] Referring Provider:  Glenna Fellows, MD  Encounter Date: 12/29/2014      PT End of Session - 12/29/14 0855    Visit Number 3   Number of Visits 10   Date for PT Re-Evaluation 01/20/15   Authorization Type Self pay   Authorization Time Period 12/21/14-02/15/15   PT Start Time 0846   PT Stop Time 0932   PT Time Calculation (min) 46 min   Activity Tolerance Patient tolerated treatment well   Behavior During Therapy [facility] for tasks assessed/performed      Past Medical History  Diagnosis Date  . Hypertension   . Migraines   . Chronic low back pain 09/16/2014  . Common migraine 09/16/2014  . Obese     Past Surgical History  Procedure Laterality Date  . Abdominal hysterectomy    . Hernia repair    . Cyst removal hand    . Rotator cuff repair Left   . Tonsillectomy    . Cesarean section      There were no vitals filed for this visit.  Visit Diagnosis:  Bilateral low back pain, with sciatica presence unspecified  Weakness of both legs  Abdominal weakness  Decreased functional activity tolerance  Stiffness of left hip joint      Subjective Assessment - 12/29/14 0846    Subjective Pt stated lower back is stiff today.  Current pain scale 6/10 lower    Currently in Pain? Yes   Pain Score 6    Pain Location Back   Pain Orientation Lower   Pain Descriptors / Indicators Tightness            [facility] Adult PT Treatment/Exercise - 12/29/14 0001    Exercises   Exercises Lumbar   Lumbar Exercises: Stretches   Single Knee to Chest Stretch 5 reps;10 seconds   Lower Trunk Rotation Limitations 10 reps, 3 seconds   Prone on Elbows Stretch 2 reps;60 seconds   Piriformis Stretch 2 reps;30 seconds   Piriformis Stretch Limitations  seated   Lumbar Exercises: Standing   Functional Squats 15 reps   Functional Squats Limitations 3D hip excursion   Forward Lunge 10 reps   Forward Lunge Limitations 4in step   Lumbar Exercises: Supine   Ab Set 15 reps;3 seconds   Bent Knee Raise 10 reps;5 seconds   Bent Knee Raise Limitations with ab set   Bridge Limitations 2 sets 10 reps: 1) neutral 2) Lt foot closer following MET   Straight Leg Raise 10 reps   Straight Leg Raises Limitations Rt only following MET with ab set   Lumbar Exercises: Prone   Straight Leg Raise 5 reps              PT Short Term Goals - 12/21/14 1501    PT SHORT TERM GOAL #1   Title Pt will be independent with HEP.    Time 2   Period Weeks   Status New   PT SHORT TERM GOAL #2   Title Improve BLE strength to 4/5 or greater to improve gait mechanics.    Time 2   Period Weeks   Status New   PT SHORT TERM GOAL #3   Title Improve core strength to 4/5 to allow pt to stand for 45 minutes  without pain.    Time 2   Period Weeks   Status New           PT Long Term Goals - 12/21/14 1504    PT LONG TERM GOAL #1   Title Pt will be independent with advanced HEP for core strengthening.    Time 4   Period Weeks   Status New   PT LONG TERM GOAL #2   Title Improve BLE strength to 4+/5 or greater to improve gait mechanics and functional mobility.    Time 4   Period Weeks   Status New   PT LONG TERM GOAL #3   Title Improve L hip IR to 25 degrees or greater to decrease pain and improve mobility.    Time 4   Period Weeks   Status New   PT LONG TERM GOAL #4   Title Complete five time sit to stand in 12 seconds or less to demonstrate improved BLE power and strength.   Time 4   Period Weeks   Status New   PT LONG TERM GOAL #5   Title Pt will stand for one hour without LBP or pain in legs to demonstrate improved core strength.    Time 4   Period Weeks   Status New               Plan - 12/29/14 0907    Clinical Impression Statement  Added 3D hip excursion and piriformis stretches to POC to improve hip mobility and reduce tightness.  Pt c/o increased lower back pain Lt side with forward lunges on step today.  Upon palpation of lower back noted SI disfunction, muscle energy technique complete for Lt SI anterior rotation followed by core strengthening exercises.  Verbal and tactile cueing to improve TrA activation.  Pt reported decrease lower back pain at end of session, did c/o Lt knee pain during bent knee raise and Rt thumb she believes related to arthritis, pt encouraged to make apt with MD if symptoms progress.   PT Next Visit Plan Check SI with MET PRN.  Continue with core strengthening and BLE strengthening        Problem List Patient Active Problem List   Diagnosis Date Noted  . Chronic low back pain 09/16/2014  . Common migraine 09/16/2014   Ihor Austin, [place]; [place]  Aldona Lento 12/29/2014, 9:43 AM  [place], [place], [address] Phone: [phone]   Fax:  [phone]

## 2015-01-03 ENCOUNTER — Ambulatory Visit (HOSPITAL_COMMUNITY): Payer: Self-pay | Admitting: Physical Therapy

## 2015-01-03 DIAGNOSIS — M545 Low back pain: Secondary | ICD-10-CM

## 2015-01-03 DIAGNOSIS — R29898 Other symptoms and signs involving the musculoskeletal system: Secondary | ICD-10-CM

## 2015-01-03 DIAGNOSIS — R198 Other specified symptoms and signs involving the digestive system and abdomen: Secondary | ICD-10-CM

## 2015-01-03 DIAGNOSIS — R6889 Other general symptoms and signs: Secondary | ICD-10-CM

## 2015-01-03 NOTE — Therapy (Signed)
[facility] [address] [place], [place], [address] Phone: [phone]   Fax:  [phone]  Physical Therapy Treatment  Patient Details  Name: Glenda White MRN: [id number] Date of Birth: Apr 25, 1955 Referring Provider: Glenna Fellows  Encounter Date: 01/03/2015      PT End of Session - 01/03/15 0934    Visit Number 4   Number of Visits 10   Date for PT Re-Evaluation 01/20/15   Authorization Type Self pay   Authorization Time Period 12/21/14-02/15/15   PT Start Time 0845   PT Stop Time 0927   PT Time Calculation (min) 42 min   Activity Tolerance Patient tolerated treatment well   Behavior During Therapy [facility] for tasks assessed/performed      Past Medical History  Diagnosis Date  . Hypertension   . Migraines   . Chronic low back pain 09/16/2014  . Common migraine 09/16/2014  . Obese     Past Surgical History  Procedure Laterality Date  . Abdominal hysterectomy    . Hernia repair    . Cyst removal hand    . Rotator cuff repair Left   . Tonsillectomy    . Cesarean section      There were no vitals filed for this visit.  Visit Diagnosis:  Bilateral low back pain, with sciatica presence unspecified  Weakness of both legs  Abdominal weakness  Decreased functional activity tolerance      Subjective Assessment - 01/03/15 0848    Subjective Pt reports that she doesn't have a lot of pain, but she feels a lot of stiffness.    Currently in Pain? No/denies   Pain Score 0-No pain            [facility] PT Assessment - 01/03/15 0001    Assessment   Referring Provider Glenna Fellows                     [facility] Adult PT Treatment/Exercise - 01/03/15 0001    Exercises   Exercises Lumbar   Lumbar Exercises: Stretches   Active Hamstring Stretch 3 reps;30 seconds   Active Hamstring Stretch Limitations 12" step   Single Knee to Chest Stretch 5 reps;10 seconds   Lower Trunk Rotation Limitations 10 reps, BLE on swiss ball    Piriformis Stretch 2 reps;30 seconds   Piriformis Stretch Limitations seated   Lumbar Exercises: Standing   Functional Squats 15 reps   Functional Squats Limitations 3D hip excursion   Forward Lunge 10 reps   Forward Lunge Limitations 4in step   Other Standing Lumbar Exercises oblique punches with RTB x 10, hip extension with trunk flexed x 10 bilat   Other Standing Lumbar Exercises sidestepping with RTB x 2RT   Lumbar Exercises: Seated   Other Seated Lumbar Exercises lumbar stretch: swiss ball roll out 10" x 10   Lumbar Exercises: Supine   Ab Set 10 reps;3 seconds   Bent Knee Raise 10 reps;5 seconds   Bridge 10 reps   Bridge Limitations Lt foot cloer following MET   Lumbar Exercises: Quadruped   Straight Leg Raise 10 reps   Manual Therapy   Manual Therapy Muscle Energy Technique   Muscle Energy Technique For left anterior rotation                PT Education - 01/03/15 0934    Education provided No          PT Short Term Goals - 12/21/14 1501  PT SHORT TERM GOAL #1   Title Pt will be independent with HEP.    Time 2   Period Weeks   Status New   PT SHORT TERM GOAL #2   Title Improve BLE strength to 4/5 or greater to improve gait mechanics.    Time 2   Period Weeks   Status New   PT SHORT TERM GOAL #3   Title Improve core strength to 4/5 to allow pt to stand for 45 minutes without pain.    Time 2   Period Weeks   Status New           PT Long Term Goals - 12/21/14 1504    PT LONG TERM GOAL #1   Title Pt will be independent with advanced HEP for core strengthening.    Time 4   Period Weeks   Status New   PT LONG TERM GOAL #2   Title Improve BLE strength to 4+/5 or greater to improve gait mechanics and functional mobility.    Time 4   Period Weeks   Status New   PT LONG TERM GOAL #3   Title Improve L hip IR to 25 degrees or greater to decrease pain and improve mobility.    Time 4   Period Weeks   Status New   PT LONG TERM GOAL #4   Title  Complete five time sit to stand in 12 seconds or less to demonstrate improved BLE power and strength.   Time 4   Period Weeks   Status New   PT LONG TERM GOAL #5   Title Pt will stand for one hour without LBP or pain in legs to demonstrate improved core strength.    Time 4   Period Weeks   Status New               Plan - 01/03/15 0934    Clinical Impression Statement Added sidestepping with tband, quadruped hip extension, and oblique punches today to improve core and glut strength in order to decrease LBP. Pt presented with anterior rotation of L SI, which was corrected with MET and supported with strengthening exercises. Pt reported some knee pain when in quadruped, but denied any increased symptoms in her back throughout treatment session.    PT Next Visit Plan Check SI with MET PRN.  Add side lunges and progress tband level with oblique punches if tolerated        Problem List Patient Active Problem List   Diagnosis Date Noted  . Chronic low back pain 09/16/2014  . Common migraine 09/16/2014    Hilma Favors, PT, DPT [phone] 01/03/2015, 9:38 AM  [facility] [address] [place], [place], [address] Phone: [phone]   Fax:  [phone]  Name: Glenda White MRN: [id number] Date of Birth: 21-Apr-1955

## 2015-01-06 ENCOUNTER — Ambulatory Visit (HOSPITAL_COMMUNITY): Payer: Self-pay

## 2015-01-06 DIAGNOSIS — M25652 Stiffness of left hip, not elsewhere classified: Secondary | ICD-10-CM

## 2015-01-06 DIAGNOSIS — R6889 Other general symptoms and signs: Secondary | ICD-10-CM

## 2015-01-06 DIAGNOSIS — M545 Low back pain: Secondary | ICD-10-CM

## 2015-01-06 DIAGNOSIS — R29898 Other symptoms and signs involving the musculoskeletal system: Secondary | ICD-10-CM

## 2015-01-06 DIAGNOSIS — R198 Other specified symptoms and signs involving the digestive system and abdomen: Secondary | ICD-10-CM

## 2015-01-06 NOTE — Therapy (Signed)
[facility] [place], [place], [address] Phone: [phone]   Fax:  [phone]  Physical Therapy Treatment  Patient Details  Name: Glenda White MRN: [id number] Date of Birth: 1956/01/19 Referring Provider: Glenna Fellows  Encounter Date: 01/06/2015      PT End of Session - 01/06/15 0945    Visit Number 5   Number of Visits 10   Date for PT Re-Evaluation 01/20/15   Authorization Type Self pay   Authorization Time Period 12/21/14-02/15/15   PT Start Time 0933   PT Stop Time 1020   PT Time Calculation (min) 47 min      Past Medical History  Diagnosis Date  . Hypertension   . Migraines   . Chronic low back pain 09/16/2014  . Common migraine 09/16/2014  . Obese     Past Surgical History  Procedure Laterality Date  . Abdominal hysterectomy    . Hernia repair    . Cyst removal hand    . Rotator cuff repair Left   . Tonsillectomy    . Cesarean section      There were no vitals filed for this visit.  Visit Diagnosis:  Bilateral low back pain, with sciatica presence unspecified  Weakness of both legs  Abdominal weakness  Decreased functional activity tolerance  Stiffness of left hip joint      Subjective Assessment - 01/06/15 0927    Subjective Pt stated she is really tired, stated pain Lt arm, Lt knee and Bil ankles have been waking pt up at night with pain.  Lt hand has been swollen for a couple weeks.  Current back pain scale 5/10, really stiff and sore today.  Pt stated she plans to call MD about swollen hand and increased  pain knee and ankles   Currently in Pain? Yes   Pain Score 5    Pain Location Back   Pain Orientation Lower   Pain Descriptors / Indicators Sore;Tightness             [facility] Adult PT Treatment/Exercise - 01/06/15 0001    Exercises   Exercises Lumbar   Lumbar Exercises: Stretches   Active Hamstring Stretch 3 reps;30 seconds   Active Hamstring Stretch Limitations supine with  rope   Single Knee to Chest Stretch 5 reps;10 seconds   Lower Trunk Rotation Limitations 10 reps, BLE on swiss ball   Lumbar Exercises: Standing   Functional Squats 15 reps   Functional Squats Limitations 3D hip excursion   Forward Lunge 10 reps   Forward Lunge Limitations 4in step   Side Lunge 10 reps   Side Lunge Limitations 4in step   Lumbar Exercises: Supine   Ab Set 10 reps;3 seconds   Bent Knee Raise 10 reps;5 seconds   Bridge 10 reps   Bridge Limitations Lt foot cloer following MET   Straight Leg Raise 10 reps   Straight Leg Raises Limitations Rt only following MET with ab set   Lumbar Exercises: Sidelying   Hip Abduction 15 reps   Lumbar Exercises: Quadruped   Straight Leg Raise 10 reps   Manual Therapy   Manual Therapy Muscle Energy Technique   Muscle Energy Technique MET for Lt SI anterior rotation f/b core strengthening             PT Short Term Goals - 12/21/14 1501    PT SHORT TERM GOAL #1   Title Pt will be independent with HEP.    Time 2  Period Weeks   Status New   PT SHORT TERM GOAL #2   Title Improve BLE strength to 4/5 or greater to improve gait mechanics.    Time 2   Period Weeks   Status New   PT SHORT TERM GOAL #3   Title Improve core strength to 4/5 to allow pt to stand for 45 minutes without pain.    Time 2   Period Weeks   Status New           PT Long Term Goals - 12/21/14 1504    PT LONG TERM GOAL #1   Title Pt will be independent with advanced HEP for core strengthening.    Time 4   Period Weeks   Status New   PT LONG TERM GOAL #2   Title Improve BLE strength to 4+/5 or greater to improve gait mechanics and functional mobility.    Time 4   Period Weeks   Status New   PT LONG TERM GOAL #3   Title Improve L hip IR to 25 degrees or greater to decrease pain and improve mobility.    Time 4   Period Weeks   Status New   PT LONG TERM GOAL #4   Title Complete five time sit to stand in 12 seconds or less to demonstrate improved  BLE power and strength.   Time 4   Period Weeks   Status New   PT LONG TERM GOAL #5   Title Pt will stand for one hour without LBP or pain in legs to demonstrate improved core strength.    Time 4   Period Weeks   Status New               Plan - 01/06/15 1030    Clinical Impression Statement Muscle energy technique complete for Lt SI anterior rotation followed by education on importance of improving core strengthening to assist with SI alignment.  Session focus on hip mobility exercises to reduce stiffness and core/gluteal strengthening in order to decrease LBP.  Added sidelunges for glut med strengthening with cueing for form.  Pt c/o Lt knee pain through session, no reports of increased pain through session.     PT Next Visit Plan Check SI with MET PRN.  Add side lunges and progress tband level with oblique punches if tolerated        Problem List Patient Active Problem List   Diagnosis Date Noted  . Chronic low back pain 09/16/2014  . Common migraine 09/16/2014   Ihor Austin, [place]; [place]   Aldona Lento 01/06/2015, 10:42 AM  [facility] [place], [place], [address] Phone: [phone]   Fax:  [phone]  Name: MISHAL PROBERT MRN: [id number] Date of Birth: [birth year]

## 2015-01-11 ENCOUNTER — Ambulatory Visit (HOSPITAL_COMMUNITY): Payer: Self-pay

## 2015-01-11 DIAGNOSIS — R6889 Other general symptoms and signs: Secondary | ICD-10-CM

## 2015-01-11 DIAGNOSIS — R198 Other specified symptoms and signs involving the digestive system and abdomen: Secondary | ICD-10-CM

## 2015-01-11 DIAGNOSIS — M545 Low back pain: Secondary | ICD-10-CM

## 2015-01-11 DIAGNOSIS — M25652 Stiffness of left hip, not elsewhere classified: Secondary | ICD-10-CM

## 2015-01-11 DIAGNOSIS — R29898 Other symptoms and signs involving the musculoskeletal system: Secondary | ICD-10-CM

## 2015-01-11 NOTE — Therapy (Signed)
[facility] [address] [place], [place], [address] Phone: [phone]   Fax:  [phone]  Physical Therapy Treatment  Patient Details  Name: Glenda White MRN: [id number] Date of Birth: [birth year] Referring Provider: Glenna Fellows  Encounter Date: 01/11/2015      PT End of Session - 01/11/15 1435    Visit Number 6   Number of Visits 10   Date for PT Re-Evaluation 01/20/15   Authorization Type Self pay   Authorization Time Period 12/21/14-02/15/15   PT Start Time 1428   PT Stop Time 1518   PT Time Calculation (min) 50 min   Activity Tolerance Patient tolerated treatment well   Behavior During Therapy [facility] for tasks assessed/performed      Past Medical History  Diagnosis Date  . Hypertension   . Migraines   . Chronic low back pain 09/16/2014  . Common migraine 09/16/2014  . Obese     Past Surgical History  Procedure Laterality Date  . Abdominal hysterectomy    . Hernia repair    . Cyst removal hand    . Rotator cuff repair Left   . Tonsillectomy    . Cesarean section      There were no vitals filed for this visit.  Visit Diagnosis:  Bilateral low back pain, with sciatica presence unspecified  Weakness of both legs  Abdominal weakness  Decreased functional activity tolerance  Stiffness of left hip joint      Subjective Assessment - 01/11/15 1426    Subjective Pt stated back not real issue today with pain; went to primary MD about pain Lt hand (thumb swollen), medial left knee and Bil ankles.  Current pain scale 6/10.   Currently in Pain? Yes   Pain Score 5    Pain Location Back   Pain Orientation Lower   Pain Descriptors / Indicators Tightness   Multiple Pain Sites Yes   Pain Score 6   Pain Location --  Lt hand, Lt knee, Bil ankles   Pain Orientation Left   Pain Descriptors / Indicators Aching            [facility] PT Assessment - 01/11/15 0001    Assessment   Medical Diagnosis LBP   Referring Provider  Glenna Fellows   Next MD Visit [place] 01/18/2015   Prior Therapy no            [facility] Adult PT Treatment/Exercise - 01/11/15 0001    Lumbar Exercises: Stretches   Active Hamstring Stretch 3 reps;30 seconds   Active Hamstring Stretch Limitations supine with rope   Single Knee to Chest Stretch 2 reps;30 seconds   Lower Trunk Rotation Limitations 10x 10"   Quad Stretch 3 reps;30 seconds   Quad Stretch Limitations gastroc slant board   Piriformis Stretch 2 reps;30 seconds   Piriformis Stretch Limitations seated   Lumbar Exercises: Standing   Functional Squats 15 reps   Functional Squats Limitations 3D hip excursion   Forward Lunge 10 reps   Forward Lunge Limitations 4in step   Side Lunge 10 reps   Side Lunge Limitations 4in step   Lumbar Exercises: Supine   Ab Set 15 reps;3 seconds   Bent Knee Raise 10 reps;5 seconds   Bridge 10 reps   Bridge Limitations BLE   Lumbar Exercises: Sidelying   Hip Abduction 15 reps   Lumbar Exercises: Prone   Straight Leg Raise 10 reps  unable to tolerate quadruped position today due to Lt knee  p   Manual Therapy   Manual Therapy Muscle Energy Technique   Muscle Energy Technique MET for BIl SI outflare f/b core strengthening           PT Short Term Goals - 12/21/14 1501    PT SHORT TERM GOAL #1   Title Pt will be independent with HEP.    Time 2   Period Weeks   Status New   PT SHORT TERM GOAL #2   Title Improve BLE strength to 4/5 or greater to improve gait mechanics.    Time 2   Period Weeks   Status New   PT SHORT TERM GOAL #3   Title Improve core strength to 4/5 to allow pt to stand for 45 minutes without pain.    Time 2   Period Weeks   Status New           PT Long Term Goals - 12/21/14 1504    PT LONG TERM GOAL #1   Title Pt will be independent with advanced HEP for core strengthening.    Time 4   Period Weeks   Status New   PT LONG TERM GOAL #2   Title Improve BLE strength to 4+/5 or greater to improve gait mechanics and  functional mobility.    Time 4   Period Weeks   Status New   PT LONG TERM GOAL #3   Title Improve L hip IR to 25 degrees or greater to decrease pain and improve mobility.    Time 4   Period Weeks   Status New   PT LONG TERM GOAL #4   Title Complete five time sit to stand in 12 seconds or less to demonstrate improved BLE power and strength.   Time 4   Period Weeks   Status New   PT LONG TERM GOAL #5   Title Pt will stand for one hour without LBP or pain in legs to demonstrate improved core strength.    Time 4   Period Weeks   Status New               Plan - 01/11/15 1443    Clinical Impression Statement Muscle energy technique complete for Bil  SI outflare followed by core strengthening exercises.  Pt reports pain relief following MET though reports increased stiffness following.  Continued mobility activtiies and stretches this session to improve spinal mobility.  Therex focus on core and gluteal musculature strengthening.  Hip extension complete in prone rather than quadruped due to Lt knee pain.   PT Next Visit Plan Check SI with MET PRN.  Continue with current PT POC.        Problem List Patient Active Problem List   Diagnosis Date Noted  . Chronic low back pain 09/16/2014  . Common migraine 09/16/2014   Ihor Austin, [place]; [place]  Aldona Lento 01/11/2015, 4:13 PM  [facility] [place], [place], [address] Phone: [phone]   Fax:  [phone]  Name: Glenda White MRN: [id number] Date of Birth: March 02, 1956

## 2015-01-13 ENCOUNTER — Ambulatory Visit (HOSPITAL_COMMUNITY): Payer: Self-pay | Admitting: Physical Therapy

## 2015-01-13 DIAGNOSIS — R198 Other specified symptoms and signs involving the digestive system and abdomen: Secondary | ICD-10-CM

## 2015-01-13 DIAGNOSIS — M545 Low back pain: Secondary | ICD-10-CM

## 2015-01-13 DIAGNOSIS — R29898 Other symptoms and signs involving the musculoskeletal system: Secondary | ICD-10-CM

## 2015-01-13 DIAGNOSIS — R6889 Other general symptoms and signs: Secondary | ICD-10-CM

## 2015-01-13 NOTE — Therapy (Signed)
[facility] [address] [place], [place], [address] Phone: [phone]   Fax:  [phone]  Physical Therapy Treatment  Patient Details  Name: Glenda SearBeverly W White MRN: [id number] Date of Birth: March 26, 1955 Referring Provider: Trey SailorsMark Roy  Encounter Date: 01/13/2015      PT End of Session - 01/13/15 0928    Visit Number 7   Number of Visits 10   Date for PT Re-Evaluation 01/20/15   Authorization Type Self pay   Authorization Time Period 12/21/14-02/15/15   PT Start Time 0845   PT Stop Time 0927   PT Time Calculation (min) 42 min   Activity Tolerance Patient tolerated treatment well   Behavior [facility] Therapy [facility] for tasks assessed/performed      Past Medical History  Diagnosis Date  . Hypertension   . Migraines   . Chronic low back pain 09/16/2014  . Common migraine 09/16/2014  . Obese     Past Surgical History  Procedure Laterality Date  . Abdominal hysterectomy    . Hernia repair    . Cyst removal hand    . Rotator cuff repair Left   . Tonsillectomy    . Cesarean section      There were no vitals filed for this visit.  Visit Diagnosis:  Bilateral low back pain, with sciatica presence unspecified  Weakness of both legs  Abdominal weakness  Decreased functional activity tolerance      Subjective Assessment - 01/13/15 0849    Subjective Pt reports that she did a lot of housework yesterday, she had to take a break about every 30 minutes d/t increased pain in her back. 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    Currently in Pain? Yes   Pain Score 7    Pain Location Back                [facility] Adult PT Treatment/Exercise - 01/13/15 0001    Lumbar Exercises: Stretches   Active Hamstring Stretch 3 reps;30 seconds   Active Hamstring Stretch Limitations 12" step   Single Knee to Chest Stretch 5 reps;10 seconds   Lower Trunk Rotation  Limitations 10 reps, 5 seconds, BLE on swiss ball   Prone on Elbows Stretch 2 reps;30 seconds   Piriformis Stretch 2 reps;30 seconds   Piriformis Stretch Limitations seated   Lumbar Exercises: Standing   Functional Squats 15 reps   Functional Squats Limitations 3D hip excursion   Other Standing Lumbar Exercises oblique punches with RTB x 10, hip extension with trunk flexed x 10 bilat   Other Standing Lumbar Exercises sidestepping with RTB x 2RT   Lumbar Exercises: Supine   Ab Set 10 reps;3 seconds   Bent Knee Raise 10 reps   Bent Knee Raise Limitations with ab set   Bridge 15 reps   Lumbar Exercises: Sidelying   Hip Abduction 15 reps   Lumbar Exercises: Prone   Other Prone Lumbar Exercises prone hip extension with knee flexed x 10 bilat                  PT Short Term Goals - 12/21/14 1501    PT SHORT TERM GOAL #1   Title Pt will be independent with HEP.    Time 2   Period Weeks   Status New   PT SHORT TERM GOAL #2   Title Improve BLE  strength to 4/5 or greater to improve gait mechanics.    Time 2   Period Weeks   Status New   PT SHORT TERM GOAL #3   Title Improve core strength to 4/5 to allow pt to stand for 45 minutes without pain.    Time 2   Period Weeks   Status New           PT Long Term Goals - 12/21/14 1504    PT LONG TERM GOAL #1   Title Pt will be independent with advanced HEP for core strengthening.    Time 4   Period Weeks   Status New   PT LONG TERM GOAL #2   Title Improve BLE strength to 4+/5 or greater to improve gait mechanics and functional mobility.    Time 4   Period Weeks   Status New   PT LONG TERM GOAL #3   Title Improve L hip IR to 25 degrees or greater to decrease pain and improve mobility.    Time 4   Period Weeks   Status New   PT LONG TERM GOAL #4   Title Complete five time sit to stand in 12 seconds or less to demonstrate improved BLE power and strength.   Time 4   Period Weeks   Status New   PT LONG TERM GOAL #5    Title Pt will stand for one hour without LBP or pain in legs to demonstrate improved core strength.    Time 4   Period Weeks   Status New               Plan - 01/13/15 1610    Clinical Impression Statement Treatment session focused on core strengthening and spinal mobility. Pt was educated on performing lumbar extension periodically when sitting through football games in order to restore lordosis in lumbar spine after sitting on bleachers. WIth prone hip extension, pt began to compensate with hamstrings, hip extension was completed with knee flexed to isolate glut musculature. Pt reported decreased pain post treatment.    PT Treatment/Interventions ADLs/Self Care Home Management;Moist Heat;Gait training;Stair training;Functional mobility training;Therapeutic activities;Therapeutic exercise;Balance training;Neuromuscular re-education;Patient/family education;Manual techniques   PT Next Visit Plan Reassess next session        Problem List Patient Active Problem List   Diagnosis Date Noted  . Chronic low back pain 09/16/2014  . Common migraine 09/16/2014    Leona Singleton, PT, DPT [phone] 01/13/2015, 9:32 AM  [facility] [address] [place], [place], [address] Phone: [phone]   Fax:  [phone]  Name: Glenda White MRN: [id number] Date of Birth: [birth year]

## 2015-01-17 ENCOUNTER — Ambulatory Visit (HOSPITAL_COMMUNITY): Payer: Self-pay | Attending: Neurosurgery | Admitting: Physical Therapy

## 2015-01-17 DIAGNOSIS — R198 Other specified symptoms and signs involving the digestive system and abdomen: Secondary | ICD-10-CM | POA: Insufficient documentation

## 2015-01-17 DIAGNOSIS — R29898 Other symptoms and signs involving the musculoskeletal system: Secondary | ICD-10-CM | POA: Insufficient documentation

## 2015-01-17 DIAGNOSIS — M545 Low back pain: Secondary | ICD-10-CM | POA: Insufficient documentation

## 2015-01-17 DIAGNOSIS — R6889 Other general symptoms and signs: Secondary | ICD-10-CM | POA: Insufficient documentation

## 2015-01-17 NOTE — Patient Instructions (Signed)
Piriformis Stretch, Sitting    Sit, one ankle on opposite knee, same-side hand on crossed knee. Push down on knee, keeping spine straight. Lean torso forward, with flat back, until tension is felt in hamstrings and gluteals of crossed-leg side. Hold __30_ seconds.  Repeat _3__ times per session. Do _1__ sessions per day.  Copyright  VHI. All rights reserved.  Knee to Chest: Sagittal Plane Stability    Bring one knee up, then return. Be sure pelvis does not rock backward or forward. Keep pelvis still. Lift knee _10__ times. Restabilize pelvis. Repeat with other leg. Do __1_ sets, __1_ times per day.  http://ss.exer.us/4   Copyright  VHI. All rights reserved.  Abduction    Lift leg up toward ceiling. Return.  Repeat __10__ times each leg. Do __1__ sessions per day.  http://[email or web address].exer.us/386   Copyright  VHI. All rights reserved.

## 2015-01-17 NOTE — Therapy (Signed)
[facility] [address] [place], [place], [address] Phone: [phone]   Fax:  [phone]  Physical Therapy Treatment (Discharge)  Patient Details  Name: Glenda White MRN: [id number] Date of Birth: 12/21/1955 Referring Provider: Glenna Fellows  Encounter Date: 01/17/2015      PT End of Session - 01/17/15 0957    Visit Number 8   Number of Visits 10   Authorization Type Self pay   Authorization Time Period 12/21/14-02/15/15   PT Start Time 0847   PT Stop Time 0930   PT Time Calculation (min) 43 min   Activity Tolerance Patient tolerated treatment well   Behavior During Therapy [facility] for tasks assessed/performed      Past Medical History  Diagnosis Date  . Hypertension   . Migraines   . Chronic low back pain 09/16/2014  . Common migraine 09/16/2014  . Obese     Past Surgical History  Procedure Laterality Date  . Abdominal hysterectomy    . Hernia repair    . Cyst removal hand    . Rotator cuff repair Left   . Tonsillectomy    . Cesarean section      There were no vitals filed for this visit.  Visit Diagnosis:  Bilateral low back pain, with sciatica presence unspecified  Weakness of both legs  Abdominal weakness  Decreased functional activity tolerance      Subjective Assessment - 01/17/15 0850    Subjective Pt reports that she still has a lot of tightness in her low back, and she felt stiff all weekend. She reports that she has been compliant with HEP. Pt reports that she has not seen a lot of improvement since beginning PT. She still has difficulty standing for a long period of time, completing housework, and standing up after sitting for a long period of time. She reports that walking has gotten a little bit easier, but she has not noticed a change with standing.    How long can you sit comfortably? no limitations- stiffness when she stands up   How long can you stand comfortably? 30-45 minutes   How long can you walk  comfortably? 30-45 minutes   Currently in Pain? Yes   Pain Score 5             [facility] PT Assessment - 01/17/15 0001    Observation/Other Assessments   Focus on Therapeutic Outcomes (FOTO)  57% limited   AROM   Right Hip External Rotation  31   Right Hip Internal Rotation  42   Left Hip External Rotation  29  knee pain   Left Hip Internal Rotation  17  knee/ankle pain   Lumbar Flexion 60  pain in knee   Lumbar Extension 29   Lumbar - Right Side Bend 21   Lumbar - Left Side Bend 25   Strength   Right Hip Flexion 4-/5  was 3+   Right Hip Extension 4-/5  was 3-   Left Hip Flexion 4-/5  was 3   Left Hip Extension 4/5  was 3-   Left Hip ABduction 4+/5  was 4   Right Knee Flexion 4/5  was 3+   Right Knee Extension 5/5  was 4+   Left Knee Flexion 4/5  was 4-   Left Knee Extension 4-/5  was 4-   Flexibility   Hamstrings SLR: L 45 degrees   Transfers   Five time sit to stand comments  9.96"  was  16.68                     [facility] Adult PT Treatment/Exercise - 01/17/15 0001    Lumbar Exercises: Stretches   Active Hamstring Stretch 3 reps;30 seconds   Active Hamstring Stretch Limitations 12" step   Piriformis Stretch 2 reps;30 seconds   Piriformis Stretch Limitations seated   Lumbar Exercises: Standing   Functional Squats 15 reps   Functional Squats Limitations 3D hip excursion   Lumbar Exercises: Supine   Bent Knee Raise 10 reps   Bent Knee Raise Limitations with ab set   Bridge 15 reps                PT Education - 01/17/15 [phone]    Education provided Yes   Education Details HEP updated, goals and progress reviewed   Person(s) Educated Patient   Methods Explanation;Handout   Comprehension Verbalized understanding;Returned demonstration          PT Short Term Goals - 01/17/15 0913    PT SHORT TERM GOAL #1   Title Pt will be independent with HEP.    Time 2   Period Weeks   Status Achieved   PT SHORT TERM GOAL #2   Title Improve  BLE strength to 4/5 or greater to improve gait mechanics.    Time 2   Period Weeks   Status Partially Met   PT SHORT TERM GOAL #3   Title Improve core strength to 4/5 to allow pt to stand for 45 minutes without pain.    Time 2   Period Weeks   Status Not Met           PT Long Term Goals - 01/17/15 0914    PT LONG TERM GOAL #1   Title Pt will be independent with advanced HEP for core strengthening.    Time 4   Period Weeks   Status Achieved   PT LONG TERM GOAL #2   Title Improve BLE strength to 4+/5 or greater to improve gait mechanics and functional mobility.    Time 4   Period Weeks   Status Partially Met   PT LONG TERM GOAL #3   Title Improve L hip IR to 25 degrees or greater to decrease pain and improve mobility.    Time 4   Period Weeks   Status Not Met   PT LONG TERM GOAL #4   Title Complete five time sit to stand in 12 seconds or less to demonstrate improved BLE power and strength.   Time 4   Period Weeks   Status Not Met   PT LONG TERM GOAL #5   Title Pt will stand for one hour without LBP or pain in legs to demonstrate improved core strength.    Time 4   Period Weeks   Status Not Met               Plan - 01/17/15 0957    Clinical Impression Statement Reassessment completed today. Pt reports that she has not noticed much progress since beginning PT, but she states that she attributes some of that to increased pain in her hands, ankles, and knee that have been getting progressively worse. Pt has met 2/5 LTGs, but continues to demonstrate decreased functional activity tolerance and core weakness. Pt is returning to her MD tomorrow, she was encouraged to discuss her sx with him and return if needed to address knee and ankle pain. Pt has been d/c at  this time to continue independently with HEP.    Pt will benefit from skilled therapeutic intervention in order to improve on the following deficits Abnormal gait;Decreased activity tolerance;Decreased  endurance;Decreased mobility;Decreased range of motion;Decreased strength;Difficulty walking;Pain   PT Treatment/Interventions ADLs/Self Care Home Management;Moist Heat;Gait training;Stair training;Functional mobility training;Therapeutic activities;Therapeutic exercise;Balance training;Neuromuscular re-education;Patient/family education;Manual techniques        Problem List Patient Active Problem List   Diagnosis Date Noted  . Chronic low back pain 09/16/2014  . Common migraine 09/16/2014    PHYSICAL THERAPY DISCHARGE SUMMARY  Visits from Start of Care: 8  Current functional level related to goals / functional outcomes: Pt demonstrates increased BLE strength, improved L hamstring length, and improved lumbar ROM.   Remaining deficits: Pt continues to demonstrate poor posture, core weakness, and decreased functional activity tolerance.    Education / Equipment: HEP  Plan: Patient agrees to discharge.  Patient goals were partially met. Patient is being discharged due to lack of progress.  ?????       Hilma Favors, PT, DPT [phone] 01/17/2015, 10:01 AM  [place], [place], [address] Phone: [phone]   Fax:  [phone]  Name: Glenda White MRN: [id number] Date of Birth: [birth year]

## 2015-02-16 DIAGNOSIS — Z0271 Encounter for disability determination: Secondary | ICD-10-CM

## 2015-03-02 ENCOUNTER — Encounter: Payer: Self-pay | Admitting: Orthopedic Surgery

## 2015-04-24 ENCOUNTER — Ambulatory Visit (INDEPENDENT_AMBULATORY_CARE_PROVIDER_SITE_OTHER): Payer: Self-pay

## 2015-04-24 ENCOUNTER — Encounter: Payer: Self-pay | Admitting: Orthopedic Surgery

## 2015-04-24 ENCOUNTER — Ambulatory Visit: Payer: Self-pay

## 2015-04-24 ENCOUNTER — Ambulatory Visit (INDEPENDENT_AMBULATORY_CARE_PROVIDER_SITE_OTHER): Payer: Self-pay | Admitting: Orthopedic Surgery

## 2015-04-24 VITALS — BP 130/78 | Ht 60.0 in | Wt 222.0 lb

## 2015-04-24 DIAGNOSIS — M79671 Pain in right foot: Secondary | ICD-10-CM

## 2015-04-24 DIAGNOSIS — M25572 Pain in left ankle and joints of left foot: Secondary | ICD-10-CM

## 2015-04-24 DIAGNOSIS — M79672 Pain in left foot: Secondary | ICD-10-CM

## 2015-04-24 NOTE — Patient Instructions (Signed)
Pickup orthotics from [employer] diclofenac 50 mg twice a day  Next follow-up will be for both hands

## 2015-04-24 NOTE — Progress Notes (Signed)
Patient ID: Glenda White, female   DOB: 07/21/1955, 60 y.o.   MRN: [id number]  Bilateral medial ankle pain   HPI Glenda White is a 60 y.o. female.  Presents with history of dorsal foot pain treated with injection by podiatry presents now with bilateral medial ankle pain Prior treatment included prednisone a meloxicam without good results. Prednisone gave her high glucose. Medial ankle pain One year No trauma Pain is associated with 8 out of 10 discomfort Swelling Stiffness and giving way Standing and walking increased pain  Review of Systems Review of Systems  Musculoskeletal: Positive for back pain and arthralgias.  All other systems reviewed and are negative.   Past Medical History  Diagnosis Date  . Hypertension   . Migraines   . Chronic low back pain 09/16/2014  . Common migraine 09/16/2014  . Obese     Past Surgical History  Procedure Laterality Date  . Abdominal hysterectomy    . Hernia repair    . Cyst removal hand    . Rotator cuff repair Left   . Tonsillectomy    . Cesarean section      Family History  Problem Relation Age of Onset  . Hypertension Mother   . Cancer - Lung Mother   . Ovarian cancer Mother   . Hypertension Sister   . Diabetes Sister   . Hyperlipidemia Sister     Social History Social History  Substance Use Topics  . Smoking status: Never Smoker   . Smokeless tobacco: Never Used  . Alcohol Use: No    No Known Allergies  Current Outpatient Prescriptions  Medication Sig Dispense Refill  . gabapentin (NEURONTIN) 100 MG capsule One capsule three times a day for 1 week, then take 2 capsules 3 times a day (Patient taking differently: 300 mg 3 (three) times daily. One capsule three times a day for 1 week, then take 2 capsules 3 times a day) 180 capsule 1  . hydrochlorothiazide (HYDRODIURIL) 25 MG tablet Take 0.5 tablets by mouth daily.    . meloxicam (MOBIC) 15 MG tablet Take 15 mg by mouth daily.    Marland Kitchen NIFEDICAL XL 30 MG 24 hr tablet  Take 1 tablet by mouth daily.    . potassium chloride SA (K-DUR,KLOR-CON) 20 MEQ tablet Take 1 tablet by mouth daily.    Marland Kitchen topiramate (TOPAMAX) 100 MG tablet Take 1 tablet by mouth daily.     No current facility-administered medications for this visit.       Physical Exam Physical Exam Blood pressure 130/78, height 5' (1.524 m), weight 222 lb (100.699 kg). Appearance, there are no abnormalities in terms of appearance the patient was well-developed and well-nourished. The grooming and hygiene were normal.  Mental status orientation, there was normal alertness and orientation Mood pleasant Ambulatory status normal with no assistive devices  Examination of the both feet standing Inspection bilateral flexible pes planus with medial tenderness Range of motion bilateral normal range of motion at the ankle joint Tests for stability bilateral anterior drawer test normal Motor strength  weakness bilateral posterior tibial tendons Skin warm dry and intact without laceration or ulceration or erythema Neurologic examination normal sensation Vascular examination normal pulses with warm extremity and normal capillary refill    Data Reviewed Imaging studies were done in the office bilateral feet 3 views each  The patient has dorsal midfoot arthritis with multiple bone spur she also has plantar bone spur and Achilles tendon spurs consistent with Achilles tendinosis  Assessment  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

## 2015-05-02 ENCOUNTER — Ambulatory Visit: Payer: Self-pay

## 2015-05-02 NOTE — Addendum Note (Signed)
Addended by: Eldridge Dace R on: 05/02/2015 01:13 PM   Modules accepted: Orders

## 2015-05-10 ENCOUNTER — Ambulatory Visit: Payer: Self-pay

## 2015-05-10 ENCOUNTER — Ambulatory Visit (INDEPENDENT_AMBULATORY_CARE_PROVIDER_SITE_OTHER): Payer: Self-pay | Admitting: Orthopedic Surgery

## 2015-05-10 ENCOUNTER — Ambulatory Visit (INDEPENDENT_AMBULATORY_CARE_PROVIDER_SITE_OTHER): Payer: Self-pay

## 2015-05-10 ENCOUNTER — Encounter: Payer: Self-pay | Admitting: Orthopedic Surgery

## 2015-05-10 VITALS — BP 130/82 | Ht 60.0 in | Wt 222.0 lb

## 2015-05-10 DIAGNOSIS — M79641 Pain in right hand: Secondary | ICD-10-CM

## 2015-05-10 DIAGNOSIS — M180 Bilateral primary osteoarthritis of first carpometacarpal joints: Secondary | ICD-10-CM

## 2015-05-10 DIAGNOSIS — M79642 Pain in left hand: Secondary | ICD-10-CM

## 2015-05-10 DIAGNOSIS — M654 Radial styloid tenosynovitis [de Quervain]: Secondary | ICD-10-CM

## 2015-05-10 MED ORDER — PREDNISONE 10 MG (21) PO TBPK: 10.0000 mg | ORAL_TABLET | Freq: Every day | ORAL | Status: DC

## 2015-05-10 NOTE — Progress Notes (Signed)
Patient ID: Glenda White, female   DOB: 08-20-1955, 60 y.o.   MRN: [id number]  ESTABLISHED PATIENT NEW PROBLEM   Chief Complaint  Patient presents with  . Hand Pain    bilateral hand pain and swelling   Glenda White is a 60 y.o. female.   HPI Evaluation of bilateral hand pain x 9-12 months   No injury Pain, swelling, locking, stiffness, giving out, tingling Sharp, dull, throbbing, aching Morning night and after activity 8 out of 10 No previous imaging worsened with lifting activities of daily living such as ironing and cooking mobic NOT helping   Review of Systems Constitutional: no fever Neuro: abnormal sensation  Back pain  Swollen joints   Past Medical History  Diagnosis Date  . Hypertension   . Migraines   . Chronic low back pain 09/16/2014  . Common migraine 09/16/2014  . Obese     Past Surgical History  Procedure Laterality Date  . Abdominal hysterectomy    . Hernia repair    . Cyst removal hand    . Rotator cuff repair Left   . Tonsillectomy    . Cesarean section      Social History Social History  Substance Use Topics  . Smoking status: Never Smoker   . Smokeless tobacco: Never Used  . Alcohol Use: No    No Known Allergies  Current Outpatient Prescriptions  Medication Sig Dispense Refill  . gabapentin (NEURONTIN) 100 MG capsule One capsule three times a day for 1 week, then take 2 capsules 3 times a day (Patient taking differently: 300 mg 3 (three) times daily. One capsule three times a day for 1 week, then take 2 capsules 3 times a day) 180 capsule 1  . hydrochlorothiazide (HYDRODIURIL) 25 MG tablet Take 0.5 tablets by mouth daily.    . meloxicam (MOBIC) 15 MG tablet Take 15 mg by mouth daily.    Marland Kitchen NIFEDICAL XL 30 MG 24 hr tablet Take 1 tablet by mouth daily.    . potassium chloride SA (K-DUR,KLOR-CON) 20 MEQ tablet Take 1 tablet by mouth daily.    Marland Kitchen topiramate (TOPAMAX) 100 MG tablet Take 1 tablet by mouth daily.     No current  facility-administered medications for this visit.       Physical Exam  Physical Exam  BP 130/82 mmHg  Ht 5' (1.524 m)  Wt 222 lb (100.699 kg)  BMI 43.36 kg/m2 The patient is well developed well nourished and well groomed.  Orientation to person place and time is normal  Mood is pleasant. Ambulatory status  normal but noncontributory Skin remains intact without laceration ulceration or erythema Gross motor exam is intact without atrophy. Muscle tone normal grade 5 motor strength Neurovascular exam remains intact Inspection both wrists there is tenderness over the first extensor compartments. Positive Finkelstein's test. Joint range of motion remains normal and is no instability.  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

## 2015-05-10 NOTE — Patient Instructions (Addendum)
Encounter Diagnoses  Name Primary?  . Right hand pain   . Left hand pain   . De Quervain's syndrome (tenosynovitis) Yes  . Primary osteoarthritis of both first carpometacarpal joints    Assessment  Dequervains syndrome bilateral  Plan  Splint for each thumb x 6 weeks   Take medicine as prescribed  Follow up as needed  De Quervain Tenosynovitis Tendons attach muscles to bones. They also help with joint movements. When tendons become irritated or swollen, it is called tendinitis. The extensor pollicis brevis (EPB) tendon connects the EPB muscle to a bone that is near the base of the thumb. The EPB muscle helps to straighten and extend the thumb. De Quervain tenosynovitis is a condition in which the EPB tendon lining (sheath) becomes irritated, thickened, and swollen. This condition is sometimes called stenosing tenosynovitis. This condition causes pain on the thumb side of the back of the wrist. CAUSES Causes of this condition include:  Activities that repeatedly cause your thumb and wrist to extend.  A sudden increase in activity or change in activity that affects your wrist. RISK FACTORS: This condition is more likely to develop in:  Females.  People who have diabetes.  Women who have recently given birth.  People who are over 70 years of age.  People who do activities that involve repeated hand and wrist motions, such as tennis, racquetball, volleyball, gardening, and taking care of children.  People who do heavy labor.  People who have poor wrist strength and flexibility.  People who do not warm up properly before activities. SYMPTOMS Symptoms of this condition include:  Pain or tenderness over the thumb side of the back of the wrist when your thumb and wrist are not moving.  Pain that gets worse when you straighten your thumb or extend your thumb or wrist.  Pain when the injured area is touched.  Locking or catching of the thumb joint while you bend and  straighten your thumb.  Decreased thumb motion due to pain.  Swelling over the affected area. DIAGNOSIS This condition is diagnosed with a medical history and physical exam. Your health care provider will ask for details about your injury and ask about your symptoms. TREATMENT Treatment may include the use of icing and medicines to reduce pain and swelling. You may also be advised to wear a splint or brace to limit your thumb and wrist motion. In less severe cases, treatment may also include working with a physical therapist to strengthen your wrist and calm the irritation around your EPB tendon sheath. In severe cases, surgery may be needed. HOME CARE INSTRUCTIONS If You Have a Splint or Brace:  Wear it as told by your health care provider. Remove it only as told by your health care provider.  Loosen the splint or brace if your fingers become numb and tingle, or if they turn cold and blue.  Keep the splint or brace clean and dry. Managing Pain, Stiffness, and Swelling   If directed, apply ice to the injured area.  Put ice in a plastic bag.  Place a towel between your skin and the bag.  Leave the ice on for 20 minutes, 2-3 times per day.  Move your fingers often to avoid stiffness and to lessen swelling.  Raise (elevate) the injured area above the level of your heart while you are sitting or lying down. General Instructions  Return to your normal activities as told by your health care provider. Ask your health care provider what activities  are safe for you.  Take over-the-counter and prescription medicines only as told by your health care provider.  Keep all follow-up visits as told by your health care provider. This is important.  Do not drive or operate heavy machinery while taking prescription pain medicine. SEEK MEDICAL CARE IF:  Your pain, tenderness, or swelling gets worse, even if you have had treatment.  You have numbness or tingling in your wrist, hand, or fingers  on the injured side.   This information is not intended to replace advice given to you by your health care provider. Make sure you discuss any questions you have with your health care provider.   Document Released: 03/04/2005 Document Revised: 11/23/2014 Document Reviewed: 05/10/2014 Elsevier Interactive Patient Education [employer]

## 2015-05-12 ENCOUNTER — Ambulatory Visit: Payer: Self-pay | Admitting: Orthopedic Surgery

## 2016-06-05 ENCOUNTER — Ambulatory Visit (INDEPENDENT_AMBULATORY_CARE_PROVIDER_SITE_OTHER): Payer: Self-pay

## 2016-06-05 ENCOUNTER — Encounter (INDEPENDENT_AMBULATORY_CARE_PROVIDER_SITE_OTHER): Payer: Self-pay | Admitting: Orthopaedic Surgery

## 2016-06-05 ENCOUNTER — Ambulatory Visit (INDEPENDENT_AMBULATORY_CARE_PROVIDER_SITE_OTHER): Payer: Self-pay | Admitting: Orthopaedic Surgery

## 2016-06-05 VITALS — BP 135/79 | HR 74 | Resp 14 | Ht 60.0 in | Wt 222.0 lb

## 2016-06-05 DIAGNOSIS — M79671 Pain in right foot: Secondary | ICD-10-CM

## 2016-06-05 DIAGNOSIS — M79672 Pain in left foot: Secondary | ICD-10-CM

## 2016-06-05 NOTE — Progress Notes (Signed)
Office Visit Note   Patient: Glenda White           Date of Birth: 05-Mar-1956           MRN: [id number] Visit Date: 06/05/2016              Requested by: Carylon Perchesoy Fagan, MD [address] [place], [place] [id number] PCP: Carylon PerchesFAGAN,ROY, MD   Assessment & Plan: Visit Diagnoses: Bilateral osteoarthritis of feet. Obesity. Bilateral pronated feet  Plan: Glenda White has a significant problem with both of her feet with multiple areas of degenerative arthritis treatment should include comfortable shoes, shoe inserts and weight loss with exercise. Presently she does not have any insurance. I suggested that she apply for Medicaid to help with the cost of shoes and inserts etc. Some point she might be a good candidate to excise the large spurs on the dorsum of her feet to help, date shoeing  Follow-Up Instructions: No Follow-up on file.   Orders:  No orders of the defined types were placed in this encounter.  No orders of the defined types were placed in this encounter.     Procedures: No procedures performed   Clinical Data: No additional findings.   Subjective: Chief Complaint  Patient presents with  . Right Foot - Pain  . Left Foot - Pain, Edema    Glenda White is a 61 year old female that presents with chronic bilateral foot pain. The right is worse than left. She relates she has just been dx with type 2 diabetes in December. She also relates she has some tingling in her feet and as the day progresses the pain and swelling travels to her ankles.Denies injury.   Glenda White is had prior cortisone injections of both feet over "10 years ago". She's not sure that made much of a difference. She presently is applying for disability as she's having difficulty applying weight to her feet or standing. Review of Systems   Objective: Vital Signs: There were no vitals taken for this visit.  Physical Exam  Ortho Exam limitation of dorsiflexion of both great toes consistent with  osteoarthritis and mild hallux rigidus. Some tingling in the toes possibly consistent with neuropathy. Good capillary refill to toes . Thickened prominent bone spurring on the dorsum of both the mid feet with mild pain. Bilateral foot pronation. No pain along the posterior tibial tendon or peroneal tendons. Posterior tib tendons appeared to be intact bilaterally.  Specialty Comments:  No specialty comments available.  Imaging: No results found.   PMFS History: Patient Active Problem List   Diagnosis Date Noted  . Chronic low back pain 09/16/2014  . Common migraine 09/16/2014   Past Medical History:  Diagnosis Date  . Chronic low back pain 09/16/2014  . Common migraine 09/16/2014  . Hypertension   . Migraines   . Obese     Family History  Problem Relation Age of Onset  . Hypertension Mother   . Cancer - Lung Mother   . Ovarian cancer Mother   . Hypertension Sister   . Diabetes Sister   . Hyperlipidemia Sister     Past Surgical History:  Procedure Laterality Date  . ABDOMINAL HYSTERECTOMY    . CESAREAN SECTION    . CYST REMOVAL HAND    . HERNIA REPAIR    . ROTATOR CUFF REPAIR Left   . TONSILLECTOMY     Social History   Occupational History  . unemployed    Social History Main  Topics  . Smoking status: Never Smoker  . Smokeless tobacco: Never Used  . Alcohol use No  . Drug use: No  . Sexual activity: Not on file

## 2016-12-24 ENCOUNTER — Other Ambulatory Visit (HOSPITAL_COMMUNITY): Payer: Self-pay | Admitting: Internal Medicine

## 2016-12-24 DIAGNOSIS — Z1231 Encounter for screening mammogram for malignant neoplasm of breast: Secondary | ICD-10-CM

## 2016-12-25 ENCOUNTER — Ambulatory Visit (HOSPITAL_COMMUNITY)
Admission: RE | Admit: 2016-12-25 | Discharge: 2016-12-25 | Disposition: A | Discharge status: Home / Self Care | Payer: PPO | Source: Ambulatory Visit | Attending: Internal Medicine | Admitting: Internal Medicine

## 2016-12-25 DIAGNOSIS — Z1231 Encounter for screening mammogram for malignant neoplasm of breast: Secondary | ICD-10-CM

## 2016-12-28 ENCOUNTER — Emergency Department (HOSPITAL_COMMUNITY): Payer: PPO

## 2016-12-28 ENCOUNTER — Emergency Department (HOSPITAL_COMMUNITY)
Admission: EM | Admit: 2016-12-28 | Discharge: 2016-12-28 | Disposition: A | Discharge status: Home / Self Care | Payer: PPO | Attending: Emergency Medicine | Admitting: Emergency Medicine

## 2016-12-28 ENCOUNTER — Encounter (HOSPITAL_COMMUNITY): Payer: Self-pay | Admitting: Emergency Medicine

## 2016-12-28 DIAGNOSIS — Z79899 Other long term (current) drug therapy: Secondary | ICD-10-CM | POA: Insufficient documentation

## 2016-12-28 DIAGNOSIS — R112 Nausea with vomiting, unspecified: Secondary | ICD-10-CM | POA: Diagnosis not present

## 2016-12-28 DIAGNOSIS — R1110 Vomiting, unspecified: Secondary | ICD-10-CM | POA: Diagnosis not present

## 2016-12-28 DIAGNOSIS — N200 Calculus of kidney: Secondary | ICD-10-CM | POA: Insufficient documentation

## 2016-12-28 DIAGNOSIS — R109 Unspecified abdominal pain: Secondary | ICD-10-CM | POA: Diagnosis not present

## 2016-12-28 DIAGNOSIS — E119 Type 2 diabetes mellitus without complications: Secondary | ICD-10-CM | POA: Insufficient documentation

## 2016-12-28 DIAGNOSIS — I10 Essential (primary) hypertension: Secondary | ICD-10-CM | POA: Diagnosis not present

## 2016-12-28 DIAGNOSIS — N132 Hydronephrosis with renal and ureteral calculous obstruction: Secondary | ICD-10-CM | POA: Diagnosis not present

## 2016-12-28 DIAGNOSIS — R1031 Right lower quadrant pain: Secondary | ICD-10-CM | POA: Diagnosis present

## 2016-12-28 LAB — CBC WITH DIFFERENTIAL/PLATELET
Basophils Absolute: 0 10*3/uL (ref 0.0–0.1)
Basophils Relative: 0 %
Eosinophils Absolute: 0.2 10*3/uL (ref 0.0–0.7)
Eosinophils Relative: 2 %
HCT: 40.4 % (ref 36.0–46.0)
Hemoglobin: 13.3 g/dL (ref 12.0–15.0)
Lymphocytes Relative: 19 %
Lymphs Abs: 2.2 10*3/uL (ref 0.7–4.0)
MCH: 30.2 pg (ref 26.0–34.0)
MCHC: 32.9 g/dL (ref 30.0–36.0)
MCV: 91.8 fL (ref 78.0–100.0)
Monocytes Absolute: 0.3 10*3/uL (ref 0.1–1.0)
Monocytes Relative: 3 %
Neutro Abs: 8.8 10*3/uL — ABNORMAL HIGH (ref 1.7–7.7)
Neutrophils Relative %: 76 %
Platelets: 224 10*3/uL (ref 150–400)
RBC: 4.4 MIL/uL (ref 3.87–5.11)
RDW: 14.9 % (ref 11.5–15.5)
WBC: 11.5 10*3/uL — ABNORMAL HIGH (ref 4.0–10.5)

## 2016-12-28 LAB — COMPREHENSIVE METABOLIC PANEL
ALT: 15 U/L (ref 14–54)
AST: 20 U/L (ref 15–41)
Albumin: 4.2 g/dL (ref 3.5–5.0)
Alkaline Phosphatase: 84 U/L (ref 38–126)
Anion gap: 12 (ref 5–15)
BUN: 11 mg/dL (ref 6–20)
CO2: 22 mmol/L (ref 22–32)
Calcium: 8.9 mg/dL (ref 8.9–10.3)
Chloride: 107 mmol/L (ref 101–111)
Creatinine, Ser: 1.03 mg/dL — ABNORMAL HIGH (ref 0.44–1.00)
GFR calc Af Amer: >60 mL/min (ref >60)
GFR calc non Af Amer: 57 mL/min — ABNORMAL LOW (ref >60)
Glucose, Bld: 196 mg/dL — ABNORMAL HIGH (ref 65–99)
Potassium: 3.1 mmol/L — ABNORMAL LOW (ref 3.5–5.1)
Sodium: 141 mmol/L (ref 135–145)
Total Bilirubin: 0.6 mg/dL (ref 0.3–1.2)
Total Protein: 7.4 g/dL (ref 6.5–8.1)

## 2016-12-28 LAB — URINALYSIS, ROUTINE W REFLEX MICROSCOPIC
Bilirubin Urine: NEGATIVE
Glucose, UA: NEGATIVE mg/dL
Ketones, ur: NEGATIVE mg/dL
Leukocytes, UA: NEGATIVE
Nitrite: NEGATIVE
Protein, ur: NEGATIVE mg/dL
Specific Gravity, Urine: 1.012 (ref 1.005–1.030)
pH: 7 (ref 5.0–8.0)

## 2016-12-28 LAB — LIPASE, BLOOD: Lipase: 30 U/L (ref 11–51)

## 2016-12-28 MED ORDER — TAMSULOSIN HCL 0.4 MG PO CAPS: 0.4000 mg | ORAL_CAPSULE | Freq: Every day | ORAL | 0 refills | Status: DC

## 2016-12-28 MED ORDER — KETOROLAC TROMETHAMINE 30 MG/ML IJ SOLN
30.0000 mg | Freq: Once | INTRAMUSCULAR | Status: AC
  Administered 2016-12-28: 30 mg via INTRAVENOUS
  Filled 2016-12-28: qty 1

## 2016-12-28 MED ORDER — ONDANSETRON HCL 4 MG PO TABS: 4.0000 mg | ORAL_TABLET | Freq: Four times a day (QID) | ORAL | 0 refills | Status: DC

## 2016-12-28 MED ORDER — SODIUM CHLORIDE 0.9 % IV BOLUS (SEPSIS)
1000.0000 mL | Freq: Once | INTRAVENOUS | Status: AC
  Administered 2016-12-28: 1000 mL via INTRAVENOUS

## 2016-12-28 MED ORDER — IOPAMIDOL (ISOVUE-300) INJECTION 61%
100.0000 mL | Freq: Once | INTRAVENOUS | Status: AC | PRN
  Administered 2016-12-28: 100 mL via INTRAVENOUS

## 2016-12-28 MED ORDER — OXYCODONE-ACETAMINOPHEN 5-325 MG PO TABS: 1.0000 | ORAL_TABLET | ORAL | 0 refills | Status: DC | PRN

## 2016-12-28 MED ORDER — ONDANSETRON HCL 4 MG/2ML IJ SOLN
4.0000 mg | Freq: Once | INTRAMUSCULAR | Status: AC
Start: 2016-12-28 — End: 2016-12-28
  Administered 2016-12-28: 4 mg via INTRAVENOUS
  Filled 2016-12-28: qty 2

## 2016-12-28 MED ORDER — MORPHINE SULFATE (PF) 4 MG/ML IV SOLN
4.0000 mg | Freq: Once | INTRAVENOUS | Status: AC
  Administered 2016-12-28: 4 mg via INTRAVENOUS
  Filled 2016-12-28: qty 1

## 2016-12-28 NOTE — ED Triage Notes (Signed)
Pt reports sudden onset of lower abd pain/vomiting around 6am this morning. Pt denies eating anything abnormal recently, denies contact with anyone with similar symptoms. Pt reports vomiting 3-4 times. Pt states she had a similar episode 3 weeks ago that passed on it own and was not seen for it. Pt denies taking any medications for symptoms.

## 2016-12-28 NOTE — Discharge Instructions (Signed)
Call the urologist listed to arrange a follow-up appt.  Strain all urine.  You can ask to be seen in their [place] office if you prefer.  Return here for any worsening symptoms

## 2016-12-28 NOTE — ED Provider Notes (Signed)
[facility]-EM[facility]Y DEPT Provider Note   CSN: [id number] Arrival date & time: 12/28/16  0755     History   Chief Complaint Chief Complaint  Patient presents with  . Abdominal Pain  . Emesis    HPI Glenda White is a 61 y.o. female.  HPI   Glenda White is a 61 y.o. female who presents to the Emergency Department complaining of right lower abdominal pain and vomiting that began suddenly at 6:00 am this morning.  States that she woke up with pain to her lower abdomen and the vomiting began shortly after.  She has had 3-4 episodes of vomiting.  Pain has been persistent.  She states that she had a similar episode of this about 3 weeks ago that resolved spontaneously.  She denies back pain, chest pain, dysuria, shortness of breath and fever.  No recent sick contacts.  No recent abdominal surgeries.  Past Medical History:  Diagnosis Date  . Chronic low back pain 09/16/2014  . Common migraine 09/16/2014  . Diabetes mellitus without complication (HCC)   . Hypertension   . Migraines   . Obese     Patient Active Problem List   Diagnosis Date Noted  . Chronic low back pain 09/16/2014  . Common migraine 09/16/2014    Past Surgical History:  Procedure Laterality Date  . ABDOMINAL HYSTERECTOMY    . CESAREAN SECTION    . CYST REMOVAL HAND    . HERNIA REPAIR    . ROTATOR CUFF REPAIR Left   . TONSILLECTOMY      OB History    No data available       Home Medications    Prior to Admission medications   Medication Sig Start Date End Date Taking? Authorizing Provider  gabapentin (NEURONTIN) 100 MG capsule One capsule three times a day for 1 week, then take 2 capsules 3 times a day Patient taking differently: 300 mg 3 (three) times daily. One capsule three times a day for 1 week, then take 2 capsules 3 times a day 09/16/14   York Spaniel, MD  hydrochlorothiazide (HYDRODIURIL) 25 MG tablet Take 0.5 tablets by mouth daily. 03/16/13   [provider]  meloxicam (MOBIC)  15 MG tablet Take 15 mg by mouth daily.    [provider]  NIFEDICAL XL 30 MG 24 hr tablet Take 1 tablet by mouth daily. 05/13/13   [provider]  potassium chloride SA (K-DUR,KLOR-CON) 20 MEQ tablet Take 1 tablet by mouth daily. 05/05/13   [provider]  predniSONE (STERAPRED UNI-PAK 21 TAB) 10 MG (21) TBPK tablet Take 1 tablet (10 mg total) by mouth daily. 05/10/15   Vickki Hearing, MD  topiramate (TOPAMAX) 100 MG tablet Take 1 tablet by mouth daily. 05/05/13   [provider]    Family History Family History  Problem Relation Age of Onset  . Hypertension Mother   . Cancer - Lung Mother   . Ovarian cancer Mother   . Hypertension Sister   . Diabetes Sister   . Hyperlipidemia Sister     Social History Social History  Substance Use Topics  . Smoking status: Never Smoker  . Smokeless tobacco: Never Used  . Alcohol use No     Allergies   Patient has no known allergies.   Review of Systems Review of Systems  Constitutional: Negative for appetite change, chills and fever.  Respiratory: Negative for shortness of breath.   Cardiovascular: Negative for chest pain.  Gastrointestinal: Positive  for abdominal pain, nausea and vomiting. Negative for blood in stool and diarrhea.  Genitourinary: Negative for decreased urine volume, difficulty urinating, dysuria and flank pain.  Musculoskeletal: Negative for back pain.  Skin: Negative for color change and rash.  Neurological: Negative for dizziness, weakness and numbness.  Hematological: Negative for adenopathy.  All other systems reviewed and are negative.    Physical Exam Updated Vital Signs BP 135/73   Pulse 89   Temp 97.9 F (36.6 C) (Oral)   Resp (!) 22   Ht 5' (1.524 m)   Wt 94.8 kg (209 lb)   SpO2 97%   BMI 40.82 kg/m   Physical Exam  Constitutional: She is oriented to person, place, and time. She appears well-developed and well-nourished.  Uncomfortable appearing  HENT:    Head: Normocephalic and atraumatic.  Mouth/Throat: Oropharynx is clear and moist. Mucous membranes are dry.  Cardiovascular: Normal rate, regular rhythm, normal heart sounds and intact distal pulses.   No murmur heard. Pulmonary/Chest: Effort normal and breath sounds normal. No respiratory distress.  Abdominal: Soft. Bowel sounds are normal. She exhibits no distension and no mass. There is tenderness in the right lower quadrant. There is no rigidity, no rebound, no guarding and no CVA tenderness.  Musculoskeletal: Normal range of motion. She exhibits no edema.  Neurological: She is alert and oriented to person, place, and time. No sensory deficit. She exhibits normal muscle tone. Coordination normal.  Skin: Skin is warm and dry.  Nursing note and vitals reviewed.    ED Treatments / Results  Labs (all labs ordered are listed, but only abnormal results are displayed) Labs Reviewed  URINALYSIS, ROUTINE W REFLEX MICROSCOPIC - Abnormal; Notable for the following:       Result Value   APPearance HAZY (*)    Hgb urine dipstick LARGE (*)    Bacteria, UA FEW (*)    Squamous Epithelial / LPF 0-5 (*)    All other components within normal limits  COMPREHENSIVE METABOLIC PANEL - Abnormal; Notable for the following:    Potassium 3.1 (*)    Glucose, Bld 196 (*)    Creatinine, Ser 1.03 (*)    GFR calc non Af Amer 57 (*)    All other components within normal limits  CBC WITH DIFFERENTIAL/PLATELET - Abnormal; Notable for the following:    WBC 11.5 (*)    Neutro Abs 8.8 (*)    All other components within normal limits  LIPASE, BLOOD    EKG  EKG Interpretation None       Radiology Ct Abdomen Pelvis W Contrast  Result Date: 12/28/2016 CLINICAL DATA:  Sudden onset lower abdominal pain with vomiting. EXAM: CT ABDOMEN AND PELVIS WITH CONTRAST TECHNIQUE: Multidetector CT imaging of the abdomen and pelvis was performed using the standard protocol following bolus administration of intravenous  contrast. CONTRAST:  ISOVUE-300 IOPAMIDOL (ISOVUE-300) INJECTION 61% COMPARISON:  None. FINDINGS: Lower chest: No acute abnormality. Hepatobiliary: No focal liver abnormality is seen. No gallstones, gallbladder wall thickening, or biliary dilatation. Pancreas: Unremarkable. No pancreatic ductal dilatation or surrounding inflammatory changes. Spleen: Normal in size without focal abnormality. Adrenals/Urinary Tract: The adrenal glands are unremarkable. There is a 5 mm calculus at the right UPJ with resultant mild right hydronephrosis. Mild right perinephric fat stranding. The left kidney and bladder are unremarkable. Stomach/Bowel: Small hiatal hernia. The stomach is otherwise within normal limits. No bowel wall thickening, obstruction, or surrounding inflammatory changes. Sigmoid diverticulosis. Normal appendix. Vascular/Lymphatic: No significant vascular findings are present.  No enlarged abdominal or pelvic lymph nodes. Reproductive: Status post hysterectomy. No adnexal masses. Other: No free fluid or pneumoperitoneum. Musculoskeletal: No acute or significant osseous findings. Degenerative changes of the lumbar spine and bilateral hip joints. IMPRESSION: 1. Obstructing 5 mm calculus at the right UPJ with resultant mild right hydronephrosis and perinephric fat stranding. Electronically Signed   By: Obie Dredge M.D.   On: 12/28/2016 10:14    Procedures Procedures (including critical care time)  Medications Ordered in ED Medications  sodium chloride 0.9 % bolus 1,000 mL (not administered)  ondansetron (ZOFRAN) injection 4 mg (not administered)     Initial Impression / Assessment and Plan / ED Course  I have reviewed the triage vital signs and the nursing notes.  Pertinent labs & imaging results that were available during my care of the patient were reviewed by me and considered in my medical decision making (see chart for details).     Pt pain improved after morphine and toradol.  Waiting  for CT  CT results discussed with patient and family.  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  Pt taking potassium supplement daily, but has not taken today.  No chest pain symptoms.  She is resting comfortably, and appears stable for d/c.  Agrees to strain all urine, urology f/u.  Return precautions discussed.    Final Clinical Impressions(s) / ED Diagnoses   Final diagnoses:  Kidney stone on right side    New Prescriptions New Prescriptions   No medications on file     Pauline Aus, Cordelia Poche 12/28/16 1155    Eber Hong, MD 12/29/16 [phone]

## 2016-12-31 ENCOUNTER — Other Ambulatory Visit: Payer: Self-pay | Admitting: Urology

## 2016-12-31 ENCOUNTER — Encounter (HOSPITAL_BASED_OUTPATIENT_CLINIC_OR_DEPARTMENT_OTHER): Payer: Self-pay | Admitting: *Deleted

## 2016-12-31 DIAGNOSIS — N201 Calculus of ureter: Secondary | ICD-10-CM | POA: Diagnosis not present

## 2016-12-31 DIAGNOSIS — N23 Unspecified renal colic: Secondary | ICD-10-CM | POA: Diagnosis not present

## 2016-12-31 NOTE — Progress Notes (Signed)
NPO AFTER MN W/ EXCEPTION CLEAR LIQUIDS UNTIL 0600.  ARRIVE AT 1015.  NEEDS EKG.  CURRENT LAB RESULTS IN CHART AND EPIC.  WILL TAKE NIFEDICAL, FLOMAX, AND LIPITOR AM DOS W/ SIPS OF WATER AND IF NEEDED MAY TAKE PERCOCET/ ZOFRAN.

## 2017-01-01 ENCOUNTER — Ambulatory Visit (HOSPITAL_BASED_OUTPATIENT_CLINIC_OR_DEPARTMENT_OTHER)
Admission: RE | Admit: 2017-01-01 | Discharge: 2017-01-01 | Disposition: A | Discharge status: Home / Self Care | Payer: PPO | Source: Ambulatory Visit | Attending: Urology | Admitting: Urology

## 2017-01-01 ENCOUNTER — Encounter (HOSPITAL_BASED_OUTPATIENT_CLINIC_OR_DEPARTMENT_OTHER)
Admission: RE | Disposition: A | Discharge status: Home / Self Care | Payer: Self-pay | Source: Ambulatory Visit | Attending: Urology

## 2017-01-01 ENCOUNTER — Encounter (HOSPITAL_BASED_OUTPATIENT_CLINIC_OR_DEPARTMENT_OTHER): Payer: Self-pay

## 2017-01-01 ENCOUNTER — Other Ambulatory Visit: Payer: Self-pay

## 2017-01-01 ENCOUNTER — Ambulatory Visit (HOSPITAL_BASED_OUTPATIENT_CLINIC_OR_DEPARTMENT_OTHER): Payer: PPO | Admitting: Anesthesiology

## 2017-01-01 DIAGNOSIS — Z6839 Body mass index (BMI) 39.0-39.9, adult: Secondary | ICD-10-CM | POA: Insufficient documentation

## 2017-01-01 DIAGNOSIS — E119 Type 2 diabetes mellitus without complications: Secondary | ICD-10-CM | POA: Insufficient documentation

## 2017-01-01 DIAGNOSIS — Z79899 Other long term (current) drug therapy: Secondary | ICD-10-CM | POA: Diagnosis not present

## 2017-01-01 DIAGNOSIS — N201 Calculus of ureter: Secondary | ICD-10-CM | POA: Diagnosis not present

## 2017-01-01 DIAGNOSIS — N132 Hydronephrosis with renal and ureteral calculous obstruction: Secondary | ICD-10-CM | POA: Insufficient documentation

## 2017-01-01 DIAGNOSIS — I10 Essential (primary) hypertension: Secondary | ICD-10-CM | POA: Diagnosis not present

## 2017-01-01 DIAGNOSIS — M545 Low back pain: Secondary | ICD-10-CM | POA: Diagnosis not present

## 2017-01-01 HISTORY — DX: Calculus of ureter: N20.1

## 2017-01-01 HISTORY — PX: CYSTOSCOPY/URETEROSCOPY/HOLMIUM LASER/STENT PLACEMENT: SHX6546

## 2017-01-01 HISTORY — DX: Type 2 diabetes mellitus without complications: E11.9

## 2017-01-01 LAB — GLUCOSE, CAPILLARY
Glucose-Capillary: 111 mg/dL — ABNORMAL HIGH (ref 65–99)
Glucose-Capillary: 114 mg/dL — ABNORMAL HIGH (ref 65–99)

## 2017-01-01 SURGERY — CYSTOSCOPY/URETEROSCOPY/HOLMIUM LASER/STENT PLACEMENT
Anesthesia: General | Laterality: Right

## 2017-01-01 MED ORDER — PROPOFOL 10 MG/ML IV BOLUS
INTRAVENOUS | Status: DC | PRN
  Administered 2017-01-01: 200 mg via INTRAVENOUS

## 2017-01-01 MED ORDER — HYDROMORPHONE HCL 1 MG/ML IJ SOLN
0.2500 mg | INTRAMUSCULAR | Status: DC | PRN
  Filled 2017-01-01: qty 0.5

## 2017-01-01 MED ORDER — KETOROLAC TROMETHAMINE 30 MG/ML IJ SOLN
INTRAMUSCULAR | Status: DC | PRN
  Administered 2017-01-01: 30 mg via INTRAVENOUS

## 2017-01-01 MED ORDER — ONDANSETRON HCL 4 MG/2ML IJ SOLN
INTRAMUSCULAR | Status: DC | PRN
  Administered 2017-01-01: 4 mg via INTRAVENOUS

## 2017-01-01 MED ORDER — KETOROLAC TROMETHAMINE 30 MG/ML IJ SOLN
INTRAMUSCULAR | Status: AC
  Filled 2017-01-01: qty 1

## 2017-01-01 MED ORDER — CEFAZOLIN SODIUM-DEXTROSE 2-4 GM/100ML-% IV SOLN
2.0000 g | Freq: Once | INTRAVENOUS | Status: AC
  Administered 2017-01-01: 2 g via INTRAVENOUS
  Filled 2017-01-01: qty 100

## 2017-01-01 MED ORDER — LIDOCAINE 2% (20 MG/ML) 5 ML SYRINGE
INTRAMUSCULAR | Status: DC | PRN
  Administered 2017-01-01: 100 mg via INTRAVENOUS

## 2017-01-01 MED ORDER — GENTAMICIN IN SALINE 1.6-0.9 MG/ML-% IV SOLN
80.0000 mg | Freq: Once | INTRAVENOUS | Status: AC
  Administered 2017-01-01: 80 mg via INTRAVENOUS
  Filled 2017-01-01: qty 50

## 2017-01-01 MED ORDER — FENTANYL CITRATE (PF) 100 MCG/2ML IJ SOLN
INTRAMUSCULAR | Status: AC
  Filled 2017-01-01: qty 2

## 2017-01-01 MED ORDER — FENTANYL CITRATE (PF) 100 MCG/2ML IJ SOLN
INTRAMUSCULAR | Status: DC | PRN
  Administered 2017-01-01: 50 ug via INTRAVENOUS

## 2017-01-01 MED ORDER — PHENYLEPHRINE 40 MCG/ML (10ML) SYRINGE FOR IV PUSH (FOR BLOOD PRESSURE SUPPORT)
PREFILLED_SYRINGE | INTRAVENOUS | Status: AC
Start: 2017-01-01 — End: 2017-01-01
  Filled 2017-01-01: qty 10

## 2017-01-01 MED ORDER — MIDAZOLAM HCL 2 MG/2ML IJ SOLN
INTRAMUSCULAR | Status: AC
  Filled 2017-01-01: qty 2

## 2017-01-01 MED ORDER — PROMETHAZINE HCL 25 MG/ML IJ SOLN
6.2500 mg | INTRAMUSCULAR | Status: DC | PRN
  Filled 2017-01-01: qty 1

## 2017-01-01 MED ORDER — SCOPOLAMINE 1 MG/3DAYS TD PT72
1.0000 | MEDICATED_PATCH | TRANSDERMAL | Status: DC
  Administered 2017-01-01: 1 via TRANSDERMAL
  Administered 2017-01-01: 1.5 mg via TRANSDERMAL
  Filled 2017-01-01: qty 1

## 2017-01-01 MED ORDER — CEFAZOLIN SODIUM-DEXTROSE 2-4 GM/100ML-% IV SOLN
INTRAVENOUS | Status: AC
  Filled 2017-01-01: qty 100

## 2017-01-01 MED ORDER — DEXAMETHASONE SODIUM PHOSPHATE 10 MG/ML IJ SOLN
INTRAMUSCULAR | Status: DC | PRN
  Administered 2017-01-01: 10 mg via INTRAVENOUS

## 2017-01-01 MED ORDER — PROPOFOL 10 MG/ML IV BOLUS
INTRAVENOUS | Status: AC
  Filled 2017-01-01: qty 40

## 2017-01-01 MED ORDER — LACTATED RINGERS IV SOLN
INTRAVENOUS | Status: DC
  Administered 2017-01-01: 11:00:00 via INTRAVENOUS
  Filled 2017-01-01: qty 1000

## 2017-01-01 MED ORDER — OXYCODONE-ACETAMINOPHEN 5-325 MG PO TABS: 1.0000 | ORAL_TABLET | ORAL | 0 refills | Status: DC | PRN

## 2017-01-01 MED ORDER — SCOPOLAMINE 1 MG/3DAYS TD PT72
MEDICATED_PATCH | TRANSDERMAL | Status: AC
  Filled 2017-01-01: qty 1

## 2017-01-01 MED ORDER — CIPROFLOXACIN HCL 500 MG PO TABS: 500.0000 mg | ORAL_TABLET | Freq: Two times a day (BID) | ORAL | 0 refills | Status: AC

## 2017-01-01 MED ORDER — ONDANSETRON HCL 4 MG PO TABS: 4.0000 mg | ORAL_TABLET | Freq: Every day | ORAL | 1 refills | Status: DC | PRN

## 2017-01-01 MED ORDER — PHENYLEPHRINE HCL 10 MG/ML IJ SOLN
INTRAMUSCULAR | Status: DC | PRN
Start: 2017-01-01 — End: 2017-01-01
  Administered 2017-01-01 (×2): 120 ug via INTRAVENOUS

## 2017-01-01 SURGICAL SUPPLY — 29 items
APL SKNCLS STERI-STRIP NONHPOA (GAUZE/BANDAGES/DRESSINGS)
BAG DRAIN URO-CYSTO SKYTR STRL (DRAIN) ×2 IMPLANT
BAG DRN UROCATH (DRAIN) ×1
BASKET STONE 1.7 NGAGE (UROLOGICAL SUPPLIES) IMPLANT
BASKET ZERO TIP NITINOL 2.4FR (BASKET) ×2 IMPLANT
BENZOIN TINCTURE PRP APPL 2/3 (GAUZE/BANDAGES/DRESSINGS) IMPLANT
BSKT STON RTRVL ZERO TP 2.4FR (BASKET) ×1
CATH INTERMIT  6FR 70CM (CATHETERS) IMPLANT
CATH URET DUAL LUMEN 6-10FR 50 (CATHETERS) ×1 IMPLANT
CLOTH BEACON ORANGE TIMEOUT ST (SAFETY) ×2 IMPLANT
FIBER LASER FLEXIVA 200 (UROLOGICAL SUPPLIES) ×1 IMPLANT
FIBER LASER FLEXIVA 365 (UROLOGICAL SUPPLIES) IMPLANT
FIBER LASER TRAC TIP (UROLOGICAL SUPPLIES) IMPLANT
GLOVE BIO SURGEON STRL SZ7.5 (GLOVE) ×2 IMPLANT
GOWN STRL REUS W/TWL XL LVL3 (GOWN DISPOSABLE) IMPLANT
GUIDEWIRE ANG ZIPWIRE 038X150 (WIRE) ×2 IMPLANT
GUIDEWIRE STR DUAL SENSOR (WIRE) IMPLANT
INFUSOR MANOMETER BAG 3000ML (MISCELLANEOUS) ×2 IMPLANT
IV NS 1000ML (IV SOLUTION) ×2
IV NS 1000ML BAXH (IV SOLUTION) IMPLANT
IV NS IRRIG 3000ML ARTHROMATIC (IV SOLUTION) ×2 IMPLANT
KIT RM TURNOVER CYSTO AR (KITS) ×2 IMPLANT
MANIFOLD NEPTUNE II (INSTRUMENTS) ×2 IMPLANT
NS IRRIG 500ML POUR BTL (IV SOLUTION) ×4 IMPLANT
PACK CYSTO (CUSTOM PROCEDURE TRAY) ×2 IMPLANT
STENT URET 6FRX26 CONTOUR (STENTS) ×1 IMPLANT
STRIP CLOSURE SKIN 1/2X4 (GAUZE/BANDAGES/DRESSINGS) IMPLANT
SYRINGE 10CC LL (SYRINGE) ×2 IMPLANT
TUBE CONNECTING 12X1/4 (SUCTIONS) IMPLANT

## 2017-01-01 NOTE — Op Note (Signed)
Operative Note  Preoperative diagnosis:  1.  5 mm right UPJ stone  Postoperative diagnosis: 1.  5 mm right UPJ stone  Procedure(s): 1.  Cystoscopy 2.  Right retrograde pyelogram 3.  Right ureteroscopy 4.  Laser lithotripsy 5.  Right JJ stent placement  Surgeon: Rhoderick Moodyhristopher Yesika Rispoli, MD  Assistants:  None  Anesthesia:  Gen LMA  Complications:  None  EBL:  <5 mL  Specimens: 1. None  Drains/Catheters: 1.  Right 6 French JJ stent w/o tether  Intraoperative findings:  Obstructing 5 mm right UPJ stone  Indication:  Glenda White is a 61 y.o. female with a 5 mm right UPJ stone. She was seen at the [facility] emergency department on 12/28/2016 for acute right flank pain associated with nausea/vomiting. She had a similar such pain episode approximately 3 weeks ago that resolved spontaneously. She had a CT stone study at that time that demonstrated a 5 mm right UPJ stone with mild hydronephrosis. She is here today for treatment of her obstructing stone.  Description of procedure:  After informed consent was obtained, the patient was brought to the operating room and general LMA anesthesia was administered. The patient was then placed in the dorsolithotomy position and prepped and draped in usual sterile fashion. A timeout was performed. A 21 French rigid cystoscope was then inserted into the urethral meatus and advanced into the bladder under direct vision. A complete bladder survey revealed no intravesical pathology.  A 6 French open-ended catheter was then inserted into the right ureteral orifice. A right retrograde pyelogram was obtained that showed no filling defects along the distal aspects of the right ureter. There was a filling defect observed in the proximal right ureter consistent with her stone that was seen on CT as well as proximal dilation of the ureter. There were no other filling defects seen within the right renal pelvis. A Glidewire was then inserted through the lumen  of the ureteral catheter and advanced up to the right renal pelvis, under fluoroscopic guidance.  A flexible ureteroscope was advanced over the wire and into position within the right proximal ureter. Her stone was observed and quickly migrated into the right renal pelvis. The wire was then removed and the stone was identified within a mid pole calyx. A 200  holmium laser was then used to fracture the stone into multiple submillimeter fragments. The Glidewire was then replaced through the working channel of the flexible ureteroscope. The ureteroscope was then removed under direct vision, leaving the wire in place.  A 6 [place] JJ stent was then placed over the wire and into good position within the right collecting system, confirming placement via fluoroscopy. The patient's bladder was then drained. She tolerated the procedure well and was transferred to the postanesthesia in stable condition.  Plan: Follow-up in 1 week for stent removal in the office

## 2017-01-01 NOTE — Anesthesia Postprocedure Evaluation (Signed)
Anesthesia Post Note  Patient: Sherri SearBeverly W Miller  Procedure(s) Performed: CYSTOSCOPY/ RETROGRADE/URETEROSCOPY/HOLMIUM LASER/STENT PLACEMENT (Right )     Patient location during evaluation: PACU Anesthesia Type: General Level of consciousness: sedated Pain management: pain level controlled Vital Signs Assessment: post-procedure vital signs reviewed and stable Respiratory status: spontaneous breathing and respiratory function stable Cardiovascular status: stable Postop Assessment: no apparent nausea or vomiting Anesthetic complications: no    Last Vitals:  Vitals:   01/01/17 1330 01/01/17 1445  BP:  (!) 109/57  Pulse: 82 69  Resp: 12 14  Temp:  36.8 C  SpO2: 97% 100%    Last Pain:  Vitals:   01/01/17 1021  TempSrc: Oral                 Arty Lantzy DANIEL

## 2017-01-01 NOTE — H&P (Signed)
Urology Preoperative H&P   Chief Complaint: Right flank pain  History of Present Illness: Glenda White is a 61 y.o. female with a 5 mm right UPJ stone. She was seen at the [facility] emergency department on 12/28/2016 for acute right flank pain associated with nausea/vomiting. She had a similar such pain episode approximately 3 weeks ago that resolved spontaneously. She had a CT stone study at that time that demonstrated a 5 mm right UPJ stone with mild hydronephrosis. She is here today for treatment of her obstructing stone.   Past Medical History:  Diagnosis Date  . Chronic low back pain 09/16/2014  . Hypertension   . Migraines   . Obstruction of right ureteropelvic junction (UPJ) due to stone   . Type 2 diabetes mellitus (HCC)     Past Surgical History:  Procedure Laterality Date  . ABDOMINAL HYSTERECTOMY  2002   w/ Unilateral Salpingo-Oophrectomy  . CESAREAN SECTION  1988  . CYST REMOVAL HAND  1980s  . ROTATOR CUFF REPAIR Left 1990s  . TONSILLECTOMY  child  . UMBILICAL HERNIA REPAIR  1970s & 1990s    Allergies:  Allergies  Allergen Reactions  . Shrimp [Shellfish Allergy] Other (See Comments)    Causes gout    Family History  Problem Relation Age of Onset  . Hypertension Mother   . Cancer - Lung Mother   . Ovarian cancer Mother   . Hypertension Sister   . Diabetes Sister   . Hyperlipidemia Sister     Social History:  reports that she has never smoked. She has never used smokeless tobacco. She reports that she does not drink alcohol or use drugs.  ROS: A complete review of systems was performed.  All systems are negative except for pertinent findings as noted.  Physical Exam:  Vital signs in last 24 hours: Temp:  [97.7 F (36.5 C)] 97.7 F (36.5 C) (10/17 1021) Pulse Rate:  [98] 98 (10/17 1021) Resp:  [16] 16 (10/17 1021) BP: (119)/(73) 119/73 (10/17 1021) SpO2:  [100 %] 100 % (10/17 1021) Weight:  [91.6 kg (202 lb)] 91.6 kg (202 lb) (10/17  1021) Constitutional:  Alert and oriented, No acute distress Cardiovascular: Regular rate and rhythm, No JVD Respiratory: Normal respiratory effort, Lungs clear bilaterally GI: Abdomen is soft, nontender, nondistended, no abdominal masses GU: No CVA tenderness Lymphatic: No lymphadenopathy Neurologic: Grossly intact, no focal deficits Psychiatric: Normal mood and affect  Laboratory Data:  No results for input(s): WBC, HGB, HCT, PLT in the last 72 hours.  No results for input(s): NA, K, CL, GLUCOSE, BUN, CALCIUM, CREATININE in the last 72 hours.  Invalid input(s): CO3   No results found for this or any previous visit (from the past 24 hour(s)). No results found for this or any previous visit (from the past 240 hour(s)).  Renal Function:  Recent Labs  12/28/16 0839  CREATININE 1.03*   Estimated Creatinine Clearance: 57.9 mL/min (A) (by C-G formula based on SCr of 1.03 mg/dL (H)).  Radiologic Imaging: No results found.  I independently reviewed the above imaging studies.  Assessment and Plan Glenda White is a 61 y.o. female with a 5 mm right UPJ calculus with mild hydronephrosis.  -The risks, benefits and alternatives of cystoscopy with right retrograde pyelogram, right ureteroscopy, holmium laser lithotripsy and right JJ stent placement was discussed with the patient. She voices understanding and wishes to proceed.    Rhoderick Moodyhristopher Nilton Lave, MD 01/01/2017, 10:23 AM  [facility] Pager: [phone]

## 2017-01-01 NOTE — Discharge Instructions (Signed)
Do not take any nonsteroidal anti inflammatories ( Ibuprofen, advill, motrin, aleve) until after 6:30 pm today.  [facility] [phone] Post Ureteroscopy With or Without Stent Instructions  Definitions:  Ureter: The duct that transports urine from the kidney to the bladder. Stent:   A plastic hollow tube that is placed into the ureter, from the kidney to the                 bladder to prevent the ureter from swelling shut.  GENERAL INSTRUCTIONS:  Despite the fact that no skin incisions were used, the area around the ureter and bladder is raw and irritated. The stent is a foreign body which will further irritate the bladder wall. This irritation is manifested by increased frequency of urination, both day and night, and by an increase in the urge to urinate. In some, the urge to urinate is present almost always. Sometimes the urge is strong enough that you may not be able to stop yourself from urinating. The only real cure is to remove the stent and then give time for the bladder wall to heal which can't be done until the danger of the ureter swelling shut has passed, which varies.  You may see some blood in your urine while the stent is in place and a few days afterwards. Do not be alarmed, even if the urine was clear for a while. Get off your feet and drink lots of fluids until clearing occurs. If you start to pass clots or don't improve, call us.  DIET: You may return to your normal diet immediately. Because of the raw surface of your bladder, alcohol, spicy foods, acid type foods and drinks with caffeine may cause irritation or frequency and should be used in moderation. To keep your urine flowing freely and to avoid constipation, drink plenty of fluids during the day ( 8-10 glasses ). Tip: Avoid cranberry juice because it is very acidic.  ACTIVITY: Your physical activity doesn't need to be restricted. However, if you are very active, you may see some blood in your urine.  We suggest that you reduce your activity under these circumstances until the bleeding has stopped.  BOWELS: It is important to keep your bowels regular during the postoperative period. Straining with bowel movements can cause bleeding. A bowel movement every other day is reasonable. Use a mild laxative if needed, such as Milk of Magnesia 2-3 tablespoons, or 2 Dulcolax tablets. Call if you continue to have problems. If you have been taking narcotics for pain, before, during or after your surgery, you may be constipated. Take a laxative if necessary.   MEDICATION: You should resume your pre-surgery medications unless told not to. In addition you will often be given an antibiotic to prevent infection. These should be taken as prescribed until the bottles are finished unless you are having an unusual reaction to one of the drugs.  PROBLEMS YOU SHOULD REPORT TO US:  Fevers over 100.5 Fahrenheit.  Heavy bleeding, or clots ( See above notes about blood in urine ).  Inability to urinate.  Drug reactions ( hives, rash, nausea, vomiting, diarrhea ).  Severe burning or pain with urination that is not improving.  FOLLOW-UP: You will need a follow-up appointment to monitor your progress. Call for this appointment at the number listed above. Usually the first appointment will be about three to fourteen days after your surgery.       Post Anesthesia Home Care Instructions  Activity: Get plenty of rest for  the remainder of the day. A responsible individual must stay with you for 24 hours following the procedure.  For the next 24 hours, DO NOT: -Drive a car -Advertising copywriter -Drink alcoholic beverages -Take any medication unless instructed by your physician -Make any legal decisions or sign important papers.  Meals: Start with liquid foods such as gelatin or soup. Progress to regular foods as tolerated. Avoid greasy, spicy, heavy foods. If nausea and/or vomiting occur, drink only clear  liquids until the nausea and/or vomiting subsides. Call your physician if vomiting continues.  Special Instructions/Symptoms: Your throat may feel dry or sore from the anesthesia or the breathing tube placed in your throat during surgery. If this causes discomfort, gargle with warm salt water. The discomfort should disappear within 24 hours.  If you had a scopolamine patch placed behind your ear for the management of post- operative nausea and/or vomiting:  1. The medication in the patch is effective for 72 hours, after which it should be removed.  Wrap patch in a tissue and discard in the trash. Wash hands thoroughly with soap and water. 2. You may remove the patch earlier than 72 hours if you experience unpleasant side effects which may include dry mouth, dizziness or visual disturbances. 3. Avoid touching the patch. Wash your hands with soap and water after contact with the patch.

## 2017-01-01 NOTE — Interval H&P Note (Signed)
History and Physical Interval Note:  01/01/2017 10:25 AM  Glenda SearBeverly W White  has presented today for surgery, with the diagnosis of RIGHT URETEROPELVIC JUNCTION STONE  The various methods of treatment have been discussed with the patient and family. After consideration of risks, benefits and other options for treatment, the patient has consented to  Procedure(s) with comments: CYSTOSCOPY/ RETROGRADE/URETEROSCOPY/HOLMIUM LASER/STENT PLACEMENT (Right) - ONLY NEEDS 30 MIN FOR PROCEDURE as a surgical intervention .  The patient's history has been reviewed, patient examined, no change in status, stable for surgery.  I have reviewed the patient's chart and labs.  Questions were answered to the patient's satisfaction.     [name]

## 2017-01-01 NOTE — Transfer of Care (Signed)
Immediate Anesthesia Transfer of Care Note  Patient: Glenda SearBeverly W Miller  Procedure(s) Performed: CYSTOSCOPY/ RETROGRADE/URETEROSCOPY/HOLMIUM LASER/STENT PLACEMENT (Right )  Patient Location: PACU  Anesthesia Type:General  Level of Consciousness: awake, alert , oriented and patient cooperative  Airway & Oxygen Therapy: Patient Spontanous Breathing and Patient connected to nasal cannula oxygen  Post-op Assessment: Report given to RN and Post -op Vital signs reviewed and stable  Post vital signs: Reviewed and stable  Last Vitals:  Vitals:   01/01/17 1021 01/01/17 1255  BP: 119/73 113/77  Pulse: 98 84  Resp: 16   Temp: 36.5 C 36.6 C  SpO2: 100% 100%    Last Pain:  Vitals:   01/01/17 1021  TempSrc: Oral      Patients Stated Pain Goal: 6 (01/01/17 1040)  Complications: No apparent anesthesia complications

## 2017-01-01 NOTE — Anesthesia Procedure Notes (Signed)
Procedure Name: LMA Insertion Date/Time: 01/01/2017 12:12 PM Performed by: Tyrone NineSAUVE, Brandalyn Harting F Pre-anesthesia Checklist: Patient identified, Timeout performed, Emergency Drugs available, Suction available and Patient being monitored Patient Re-evaluated:Patient Re-evaluated prior to induction Oxygen Delivery Method: Circle system utilized Preoxygenation: Pre-oxygenation with 100% oxygen Induction Type: IV induction Ventilation: Mask ventilation without difficulty LMA: LMA inserted LMA Size: 4.0 Number of attempts: 1 Placement Confirmation: breath sounds checked- equal and bilateral,  CO2 detector and positive ETCO2 Tube secured with: Tape Dental Injury: Teeth and Oropharynx as per pre-operative assessment

## 2017-01-01 NOTE — Anesthesia Preprocedure Evaluation (Signed)
Anesthesia Evaluation  Patient identified by MRN, date of birth, ID band Patient awake    Reviewed: Allergy & Precautions, NPO status , Patient's Chart, lab work & pertinent test results  History of Anesthesia Complications Negative for: history of anesthetic complications  Airway Mallampati: II  TM Distance: >3 FB Neck ROM: Full    Dental no notable dental hx. (+) Dental Advisory Given   Pulmonary neg pulmonary ROS,    Pulmonary exam normal        Cardiovascular hypertension, Normal cardiovascular exam     Neuro/Psych  Headaches, negative psych ROS   GI/Hepatic negative GI ROS, Neg liver ROS,   Endo/Other  diabetesMorbid obesity  Renal/GU negative Renal ROS  negative genitourinary   Musculoskeletal negative musculoskeletal ROS (+)   Abdominal   Peds negative pediatric ROS (+)  Hematology negative hematology ROS (+)   Anesthesia Other Findings   Reproductive/Obstetrics negative OB ROS                             Anesthesia Physical Anesthesia Plan  ASA: III  Anesthesia Plan: General   Post-op Pain Management:    Induction: Intravenous  PONV Risk Score and Plan: 4 or greater and Ondansetron, Dexamethasone, Scopolamine patch - Pre-op and Diphenhydramine  Airway Management Planned: LMA  Additional Equipment:   Intra-op Plan:   Post-operative Plan: Extubation in OR  Informed Consent: I have reviewed the patients History and Physical, chart, labs and discussed the procedure including the risks, benefits and alternatives for the proposed anesthesia with the patient or authorized representative who has indicated his/her understanding and acceptance.   Dental advisory given  Plan Discussed with: CRNA and Anesthesiologist  Anesthesia Plan Comments:         Anesthesia Quick Evaluation

## 2017-01-02 ENCOUNTER — Encounter (HOSPITAL_BASED_OUTPATIENT_CLINIC_OR_DEPARTMENT_OTHER): Payer: Self-pay | Admitting: Urology

## 2017-01-09 DIAGNOSIS — N201 Calculus of ureter: Secondary | ICD-10-CM | POA: Diagnosis not present

## 2017-01-14 DIAGNOSIS — Z79899 Other long term (current) drug therapy: Secondary | ICD-10-CM | POA: Diagnosis not present

## 2017-01-14 DIAGNOSIS — E119 Type 2 diabetes mellitus without complications: Secondary | ICD-10-CM | POA: Diagnosis not present

## 2017-01-14 DIAGNOSIS — E785 Hyperlipidemia, unspecified: Secondary | ICD-10-CM | POA: Diagnosis not present

## 2017-01-21 DIAGNOSIS — R040 Epistaxis: Secondary | ICD-10-CM | POA: Diagnosis not present

## 2017-01-21 DIAGNOSIS — N200 Calculus of kidney: Secondary | ICD-10-CM | POA: Diagnosis not present

## 2017-01-21 DIAGNOSIS — E119 Type 2 diabetes mellitus without complications: Secondary | ICD-10-CM | POA: Diagnosis not present

## 2017-01-21 DIAGNOSIS — Z23 Encounter for immunization: Secondary | ICD-10-CM | POA: Diagnosis not present

## 2017-02-03 DIAGNOSIS — R040 Epistaxis: Secondary | ICD-10-CM | POA: Diagnosis not present

## 2017-02-18 ENCOUNTER — Encounter: Payer: Self-pay | Admitting: *Deleted

## 2017-02-18 ENCOUNTER — Other Ambulatory Visit: Payer: Self-pay | Admitting: *Deleted

## 2017-02-18 NOTE — Patient Outreach (Addendum)
[facility] Care Management  02/18/2017  Glenda SearBeverly W White 06-23-1955 [id number]  Health risk assessment call  Placed call to patient, explained reason for the call, patient states she has changed to a different insurance plan beginning 2019, she will no longer have health team advantage. Patient reports she is managing well with hypertension at home, taking medications as prescribed.On 5 or more medications denies cost concerns .  Patient denies any new concerns at this time.   She understands how to contact [facility]  if new questions arise in the meantime Will send [facility] with [facility] contact information .   Glenda GaribaldiKimberly Resa Rinks, RN, [facility] Care [facility],Care Management Coordinator  [phone]- Mobile [phone]- Toll Free Main Office

## 2017-02-20 DIAGNOSIS — N201 Calculus of ureter: Secondary | ICD-10-CM | POA: Diagnosis not present

## 2017-02-20 DIAGNOSIS — N23 Unspecified renal colic: Secondary | ICD-10-CM | POA: Diagnosis not present

## 2017-04-02 DIAGNOSIS — M5412 Radiculopathy, cervical region: Secondary | ICD-10-CM | POA: Diagnosis not present

## 2017-04-02 DIAGNOSIS — M545 Low back pain: Secondary | ICD-10-CM | POA: Diagnosis not present

## 2017-05-27 DIAGNOSIS — E119 Type 2 diabetes mellitus without complications: Secondary | ICD-10-CM | POA: Diagnosis not present

## 2017-06-03 DIAGNOSIS — E119 Type 2 diabetes mellitus without complications: Secondary | ICD-10-CM | POA: Diagnosis not present

## 2017-06-03 DIAGNOSIS — M4696 Unspecified inflammatory spondylopathy, lumbar region: Secondary | ICD-10-CM | POA: Diagnosis not present

## 2017-06-24 ENCOUNTER — Other Ambulatory Visit: Payer: Self-pay

## 2017-06-24 ENCOUNTER — Emergency Department (HOSPITAL_COMMUNITY)
Admission: EM | Admit: 2017-06-24 | Discharge: 2017-06-25 | Disposition: A | Discharge status: Home / Self Care | Payer: Medicare Other | Attending: Emergency Medicine | Admitting: Emergency Medicine

## 2017-06-24 ENCOUNTER — Emergency Department (HOSPITAL_COMMUNITY): Payer: Medicare Other

## 2017-06-24 ENCOUNTER — Encounter (HOSPITAL_COMMUNITY): Payer: Self-pay | Admitting: *Deleted

## 2017-06-24 DIAGNOSIS — M5441 Lumbago with sciatica, right side: Secondary | ICD-10-CM | POA: Insufficient documentation

## 2017-06-24 DIAGNOSIS — Z7984 Long term (current) use of oral hypoglycemic drugs: Secondary | ICD-10-CM | POA: Insufficient documentation

## 2017-06-24 DIAGNOSIS — Z79899 Other long term (current) drug therapy: Secondary | ICD-10-CM | POA: Insufficient documentation

## 2017-06-24 DIAGNOSIS — K5730 Diverticulosis of large intestine without perforation or abscess without bleeding: Secondary | ICD-10-CM | POA: Diagnosis not present

## 2017-06-24 DIAGNOSIS — R109 Unspecified abdominal pain: Secondary | ICD-10-CM | POA: Insufficient documentation

## 2017-06-24 DIAGNOSIS — E119 Type 2 diabetes mellitus without complications: Secondary | ICD-10-CM | POA: Diagnosis not present

## 2017-06-24 DIAGNOSIS — I10 Essential (primary) hypertension: Secondary | ICD-10-CM | POA: Insufficient documentation

## 2017-06-24 DIAGNOSIS — Z7982 Long term (current) use of aspirin: Secondary | ICD-10-CM | POA: Insufficient documentation

## 2017-06-24 DIAGNOSIS — M545 Low back pain: Secondary | ICD-10-CM | POA: Diagnosis present

## 2017-06-24 LAB — CBC WITH DIFFERENTIAL/PLATELET
Basophils Absolute: 0 10*3/uL (ref 0.0–0.1)
Basophils Relative: 0 %
Eosinophils Absolute: 0.1 10*3/uL (ref 0.0–0.7)
Eosinophils Relative: 2 %
HCT: 38.8 % (ref 36.0–46.0)
Hemoglobin: 12.6 g/dL (ref 12.0–15.0)
Lymphocytes Relative: 25 %
Lymphs Abs: 2.2 10*3/uL (ref 0.7–4.0)
MCH: 29.5 pg (ref 26.0–34.0)
MCHC: 32.5 g/dL (ref 30.0–36.0)
MCV: 90.9 fL (ref 78.0–100.0)
Monocytes Absolute: 0.7 10*3/uL (ref 0.1–1.0)
Monocytes Relative: 8 %
Neutro Abs: 5.7 10*3/uL (ref 1.7–7.7)
Neutrophils Relative %: 65 %
Platelets: 215 10*3/uL (ref 150–400)
RBC: 4.27 MIL/uL (ref 3.87–5.11)
RDW: 14.7 % (ref 11.5–15.5)
WBC: 8.8 10*3/uL (ref 4.0–10.5)

## 2017-06-24 LAB — URINALYSIS, ROUTINE W REFLEX MICROSCOPIC
Bilirubin Urine: NEGATIVE
Glucose, UA: NEGATIVE mg/dL
Hgb urine dipstick: NEGATIVE
Ketones, ur: 5 mg/dL — AB
Leukocytes, UA: NEGATIVE
Nitrite: NEGATIVE
Protein, ur: NEGATIVE mg/dL
Specific Gravity, Urine: 1.026 (ref 1.005–1.030)
pH: 5 (ref 5.0–8.0)

## 2017-06-24 LAB — BASIC METABOLIC PANEL
Anion gap: 12 (ref 5–15)
BUN: 12 mg/dL (ref 6–20)
CO2: 23 mmol/L (ref 22–32)
Calcium: 9.6 mg/dL (ref 8.9–10.3)
Chloride: 107 mmol/L (ref 101–111)
Creatinine, Ser: 0.81 mg/dL (ref 0.44–1.00)
GFR calc Af Amer: >60 mL/min (ref >60)
GFR calc non Af Amer: >60 mL/min (ref >60)
Glucose, Bld: 80 mg/dL (ref 65–99)
Potassium: 3.4 mmol/L — ABNORMAL LOW (ref 3.5–5.1)
Sodium: 142 mmol/L (ref 135–145)

## 2017-06-24 MED ORDER — MORPHINE SULFATE (PF) 2 MG/ML IV SOLN
2.0000 mg | Freq: Once | INTRAVENOUS | Status: AC
  Administered 2017-06-24: 2 mg via INTRAVENOUS
  Filled 2017-06-24: qty 1

## 2017-06-24 MED ORDER — HYDROCODONE-ACETAMINOPHEN 5-325 MG PO TABS: ORAL_TABLET | ORAL | 0 refills | Status: DC

## 2017-06-24 MED ORDER — PREDNISONE 10 MG PO TABS: ORAL_TABLET | ORAL | 0 refills | Status: DC

## 2017-06-24 MED ORDER — ONDANSETRON HCL 4 MG/2ML IJ SOLN
4.0000 mg | Freq: Once | INTRAMUSCULAR | Status: AC
  Administered 2017-06-24: 4 mg via INTRAVENOUS
  Filled 2017-06-24: qty 2

## 2017-06-24 NOTE — Discharge Instructions (Addendum)
Alternate ice and heat to your back.  As discussed, check your blood sugar often while taking the prednisone and stop it if your blood sugar becomes elevated.  Follow-up with Dr. Alonza SmokerFagan's office.

## 2017-06-24 NOTE — ED Provider Notes (Signed)
[facility] EMERGENCY DEPARTMENT Provider Note   CSN: [id number] Arrival date & time: 06/24/17  1715     History   Chief Complaint Chief Complaint  Patient presents with  . Back Pain    HPI Glenda White is a 62 y.o. female.  HPI   Glenda White is a 62 y.o. female who presents to the Emergency Department complaining of right-sided low back pain.  She describes the pain as constant and radiating from her right lower back into her buttocks and right thigh.  Symptoms have been present for 3 days.  She is tried taking tramadol without relief.  Pain is worse with standing or walking, improves somewhat at rest.  She states that she had a previous kidney stone and that her current pain feels similar to that although she denies urinary frequency, blood in her urine, nausea, or vomiting.  She also denies numbness or weakness of the lower extremities, abdominal pain, bowel changes, fever or chills. No recent spinal procedures.    Past Medical History:  Diagnosis Date  . Chronic low back pain 09/16/2014  . Hypertension   . Migraines   . Obstruction of right ureteropelvic junction (UPJ) due to stone   . Type 2 diabetes mellitus [facility])     Patient Active Problem List   Diagnosis Date Noted  . Chronic low back pain 09/16/2014  . Common migraine 09/16/2014    Past Surgical History:  Procedure Laterality Date  . ABDOMINAL HYSTERECTOMY  2002   w/ Unilateral Salpingo-Oophrectomy  . CESAREAN SECTION  1988  . CYST REMOVAL HAND  1980s  . CYSTOSCOPY/URETEROSCOPY/HOLMIUM LASER/STENT PLACEMENT Right 01/01/2017   Procedure: CYSTOSCOPY/ RETROGRADE/URETEROSCOPY/HOLMIUM LASER/STENT PLACEMENT;  Surgeon: Rene Paci, MD;  Location: [facility];  Service: Urology;  Laterality: Right;  ONLY NEEDS 30 MIN FOR PROCEDURE  . ROTATOR CUFF REPAIR Left 1990s  . TONSILLECTOMY  child  . UMBILICAL HERNIA REPAIR  1970s & 1990s     OB History   None      Home  Medications    Prior to Admission medications   Medication Sig Start Date End Date Taking? Authorizing Provider  aspirin EC 81 MG tablet Take 81 mg by mouth daily.   Yes [provider]  atorvastatin (LIPITOR) 20 MG tablet Take 20 mg by mouth every morning.    Yes [provider]  hydrochlorothiazide (HYDRODIURIL) 25 MG tablet Take 0.5 tablets by mouth every morning.  03/16/13  Yes [provider]  metFORMIN (GLUCOPHAGE) 500 MG tablet Take 500 mg by mouth 2 (two) times daily with a meal.    Yes [provider]  NIFEDICAL XL 30 MG 24 hr tablet Take 1 tablet by mouth every morning.  05/13/13  Yes [provider]  potassium chloride SA (K-DUR,KLOR-CON) 20 MEQ tablet Take 1 tablet by mouth 2 (two) times daily.  05/05/13  Yes [provider]  topiramate (TOPAMAX) 100 MG tablet Take 1 tablet by mouth every evening.  05/05/13  Yes [provider]    Family History Family History  Problem Relation Age of Onset  . Hypertension Mother   . Cancer - Lung Mother   . Ovarian cancer Mother   . Hypertension Sister   . Diabetes Sister   . Hyperlipidemia Sister     Social History Social History   Tobacco Use  . Smoking status: Never Smoker  . Smokeless tobacco: Never Used  Substance Use Topics  . Alcohol use: No  . Drug  use: No     Allergies   Shrimp [shellfish allergy]   Review of Systems Review of Systems  Constitutional: Negative for fever.  Respiratory: Negative for shortness of breath.   Cardiovascular: Negative for chest pain.  Gastrointestinal: Negative for abdominal pain, constipation and vomiting.  Genitourinary: Positive for flank pain (right). Negative for decreased urine volume, difficulty urinating, dysuria and hematuria.  Musculoskeletal: Positive for back pain. Negative for joint swelling.  Skin: Negative for rash.  Neurological: Negative for weakness and numbness.  All other systems reviewed and are  negative.    Physical Exam Updated Vital Signs BP (!) 137/92   Pulse 75   Temp 98.7 F (37.1 C) (Oral)   Resp 15   Ht 5' (1.524 m)   Wt 91.2 kg (201 lb)   SpO2 98%   BMI 39.26 kg/m   Physical Exam  Constitutional: She is oriented to person, place, and time. She appears well-developed and well-nourished. No distress.  HENT:  Head: Normocephalic and atraumatic.  Neck: Normal range of motion. Neck supple.  Cardiovascular: Normal rate, regular rhythm and intact distal pulses.  DP pulses are strong and palpable bilaterally  Pulmonary/Chest: Effort normal and breath sounds normal. No respiratory distress.  Abdominal: Soft. She exhibits no distension. There is no tenderness.  Musculoskeletal: She exhibits tenderness. She exhibits no edema.       Lumbar back: She exhibits tenderness and pain. She exhibits normal range of motion, no swelling, no deformity, no laceration and normal pulse.  ttp of the lower lumbar spine, right lumbar paraspinal muscles and SI joint.  Positive straight leg raise on the right at 20 degrees.  Pt has 5/5 strength against resistance of bilateral lower extremities.     Neurological: She is alert and oriented to person, place, and time. She has normal strength. No sensory deficit. She exhibits normal muscle tone. Coordination and gait normal.  Reflex Scores:      Patellar reflexes are 2+ on the right side and 2+ on the left side.      Achilles reflexes are 2+ on the right side and 2+ on the left side. Skin: Skin is warm and dry. Capillary refill takes less than 2 seconds. No rash noted.  Nursing note and vitals reviewed.    ED Treatments / Results  Labs (all labs ordered are listed, but only abnormal results are displayed) Labs Reviewed  URINALYSIS, ROUTINE W REFLEX MICROSCOPIC - Abnormal; Notable for the following components:      Result Value   APPearance HAZY (*)    Ketones, ur 5 (*)    All other components within normal limits  BASIC METABOLIC PANEL  - Abnormal; Notable for the following components:   Potassium 3.4 (*)    All other components within normal limits  CBC WITH DIFFERENTIAL/PLATELET    EKG None  Radiology Ct Renal Stone Study  Result Date: 06/24/2017 CLINICAL DATA:  Low back pain EXAM: CT ABDOMEN AND PELVIS WITHOUT CONTRAST TECHNIQUE: Multidetector CT imaging of the abdomen and pelvis was performed following the standard protocol without IV contrast. COMPARISON:  12/28/2016 FINDINGS: Lower chest: No acute abnormality. Hepatobiliary: No focal liver abnormality is seen. No gallstones, gallbladder wall thickening, or biliary dilatation. Pancreas: Unremarkable. No pancreatic ductal dilatation or surrounding inflammatory changes. Spleen: Normal in size without focal abnormality. Adrenals/Urinary Tract: Adrenal glands are unremarkable. Kidneys are normal, without renal calculi, focal lesion, or hydronephrosis. Bladder is unremarkable. Stomach/Bowel: Stomach is within normal limits. Appendix appears normal. No evidence of bowel  wall thickening, distention, or inflammatory changes. Diverticular disease of the sigmoid colon. No acute inflammation. Vascular/Lymphatic: Nonaneurysmal aorta. No significantly enlarged lymph nodes. Reproductive: Status post hysterectomy. No adnexal masses. Other: Negative for free air or free fluid. Musculoskeletal: Degenerative changes of the spine. No acute or suspicious abnormality. IMPRESSION: 1. Negative for renal calculus, hydronephrosis or ureteral stone 2. Sigmoid colon diverticular disease without acute inflammation Electronically Signed   By: Jasmine PangKim  Fujinaga M.D.   On: 06/24/2017 22:55    Procedures Procedures (including critical care time)  Medications Ordered in ED Medications  morphine 2 MG/ML injection 2 mg (has no administration in time range)  ondansetron (ZOFRAN) injection 4 mg (4 mg Intravenous Given 06/24/17 2225)  morphine 2 MG/ML injection 2 mg (2 mg Intravenous Given 06/24/17 2234)      Initial Impression / Assessment and Plan / ED Course  I have reviewed the triage vital signs and the nursing notes.  Pertinent labs & imaging results that were available during my care of the patient were reviewed by me and considered in my medical decision making (see chart for details).     Patient with likely acute on chronic low back pain.  No concerning symptoms for cauda equina or spinal abscess.  Patient is feeling better after medications and requesting discharge home and she agrees to follow-up with her PCP.  Return precautions were discussed.  Final Clinical Impressions(s) / ED Diagnoses   Final diagnoses:  Acute right-sided low back pain with right-sided sciatica    ED Discharge Orders    None       Pauline Ausriplett, Rosalita Carey, PA-C 06/24/17 2340    Doug SouJacubowitz, Sam, MD 06/25/17 [phone]

## 2017-06-24 NOTE — ED Triage Notes (Addendum)
Pt c/o right lower back pain that started Sunday. Pt denies any urinary symptoms. Pt reports hx of kidney stones last year with similar pain. Denies injury.

## 2017-06-24 NOTE — ED Notes (Signed)
Patient reports low back pain that started on Sunday. Worse with movement, walking or rising from a seated position. Patient states she has had a history of kidney stones but denies urinary symptoms. Patient able to stand from wheelchair and get herself in bed. Moves slowly. Patient reports her pain is worse on the right and radiates into her right buttock.

## 2017-07-14 DIAGNOSIS — H52203 Unspecified astigmatism, bilateral: Secondary | ICD-10-CM | POA: Diagnosis not present

## 2017-07-14 DIAGNOSIS — Z7984 Long term (current) use of oral hypoglycemic drugs: Secondary | ICD-10-CM | POA: Diagnosis not present

## 2017-07-14 DIAGNOSIS — H2513 Age-related nuclear cataract, bilateral: Secondary | ICD-10-CM | POA: Diagnosis not present

## 2017-07-14 DIAGNOSIS — E119 Type 2 diabetes mellitus without complications: Secondary | ICD-10-CM | POA: Diagnosis not present

## 2017-07-14 DIAGNOSIS — H5213 Myopia, bilateral: Secondary | ICD-10-CM | POA: Diagnosis not present

## 2017-07-16 ENCOUNTER — Encounter: Payer: Self-pay | Admitting: Gastroenterology

## 2017-07-16 DIAGNOSIS — Z130 Encounter for screening for diseases of the blood and blood-forming organs and certain disorders involving the immune mechanism: Secondary | ICD-10-CM | POA: Diagnosis not present

## 2017-07-16 DIAGNOSIS — R1032 Left lower quadrant pain: Secondary | ICD-10-CM | POA: Diagnosis not present

## 2017-07-16 DIAGNOSIS — Z01419 Encounter for gynecological examination (general) (routine) without abnormal findings: Secondary | ICD-10-CM | POA: Diagnosis not present

## 2017-07-16 DIAGNOSIS — Z1389 Encounter for screening for other disorder: Secondary | ICD-10-CM | POA: Diagnosis not present

## 2017-07-22 DIAGNOSIS — R1032 Left lower quadrant pain: Secondary | ICD-10-CM | POA: Diagnosis not present

## 2017-07-28 DIAGNOSIS — Z01419 Encounter for gynecological examination (general) (routine) without abnormal findings: Secondary | ICD-10-CM | POA: Diagnosis not present

## 2017-08-20 DIAGNOSIS — M545 Low back pain: Secondary | ICD-10-CM | POA: Diagnosis not present

## 2017-08-26 DIAGNOSIS — M25511 Pain in right shoulder: Secondary | ICD-10-CM | POA: Diagnosis not present

## 2017-08-26 DIAGNOSIS — M7521 Bicipital tendinitis, right shoulder: Secondary | ICD-10-CM | POA: Diagnosis not present

## 2017-08-28 DIAGNOSIS — M47816 Spondylosis without myelopathy or radiculopathy, lumbar region: Secondary | ICD-10-CM | POA: Diagnosis not present

## 2017-08-28 DIAGNOSIS — M545 Low back pain: Secondary | ICD-10-CM | POA: Diagnosis not present

## 2017-09-03 DIAGNOSIS — M4316 Spondylolisthesis, lumbar region: Secondary | ICD-10-CM | POA: Diagnosis not present

## 2017-09-03 DIAGNOSIS — M545 Low back pain: Secondary | ICD-10-CM | POA: Diagnosis not present

## 2017-09-15 ENCOUNTER — Other Ambulatory Visit: Payer: Self-pay

## 2017-09-15 ENCOUNTER — Ambulatory Visit (AMBULATORY_SURGERY_CENTER): Payer: Self-pay

## 2017-09-15 VITALS — Ht 60.0 in | Wt 200.0 lb

## 2017-09-15 DIAGNOSIS — Z1211 Encounter for screening for malignant neoplasm of colon: Secondary | ICD-10-CM

## 2017-09-15 MED ORDER — NA SULFATE-K SULFATE-MG SULF 17.5-3.13-1.6 GM/177ML PO SOLN: 1.0000 | Freq: Once | ORAL | 0 refills | Status: AC

## 2017-09-15 NOTE — Progress Notes (Signed)
Denies allergies to eggs or soy products. Denies complication of anesthesia or sedation. Denies use of weight loss medication. Denies use of O2.   Emmi instructions declined.  

## 2017-09-29 DIAGNOSIS — M47816 Spondylosis without myelopathy or radiculopathy, lumbar region: Secondary | ICD-10-CM | POA: Diagnosis not present

## 2017-10-02 ENCOUNTER — Encounter: Payer: Self-pay | Admitting: Gastroenterology

## 2017-10-02 ENCOUNTER — Ambulatory Visit (AMBULATORY_SURGERY_CENTER): Payer: Medicare Other | Admitting: Gastroenterology

## 2017-10-02 VITALS — BP 109/52 | HR 51 | Temp 97.5°F | Resp 9 | Ht 60.0 in | Wt 200.0 lb

## 2017-10-02 DIAGNOSIS — Z1211 Encounter for screening for malignant neoplasm of colon: Secondary | ICD-10-CM | POA: Diagnosis present

## 2017-10-02 MED ORDER — SODIUM CHLORIDE 0.9 % IV SOLN: 500.0000 mL | Freq: Once | INTRAVENOUS | Status: AC

## 2017-10-02 NOTE — Patient Instructions (Signed)
*   handout on diverticulosis and hemorrhoids given*  YOU HAD AN ENDOSCOPIC PROCEDURE TODAY AT THE [facility]:   Refer to the procedure report that was given to you for any specific questions about what was found during the examination.  If the procedure report does not answer your questions, please call your gastroenterologist to clarify.  If you requested that your care partner not be given the details of your procedure findings, then the procedure report has been included in a sealed envelope for you to review at your convenience later.  YOU SHOULD EXPECT: Some feelings of bloating in the abdomen. Passage of more gas than usual.  Walking can help get rid of the air that was put into your GI tract during the procedure and reduce the bloating. If you had a lower endoscopy (such as a colonoscopy or flexible sigmoidoscopy) you may notice spotting of blood in your stool or on the toilet paper. If you underwent a bowel prep for your procedure, you may not have a normal bowel movement for a few days.  Please Note:  You might notice some irritation and congestion in your nose or some drainage.  This is from the oxygen used during your procedure.  There is no need for concern and it should clear up in a day or so.  SYMPTOMS TO REPORT IMMEDIATELY:   Following lower endoscopy (colonoscopy or flexible sigmoidoscopy):  Excessive amounts of blood in the stool  Significant tenderness or worsening of abdominal pains  Swelling of the abdomen that is new, acute  Fever of 100F or higher   For urgent or emergent issues, a gastroenterologist can be reached at any hour by calling [phone].   DIET:  We do recommend a small meal at first, but then you may proceed to your regular diet.  Drink plenty of fluids but you should avoid alcoholic beverages for 24 hours.  ACTIVITY:  You should plan to take it easy for the rest of today and you should NOT DRIVE or use heavy machinery until tomorrow  (because of the sedation medicines used during the test).    FOLLOW UP: Our staff will call the number listed on your records the next business day following your procedure to check on you and address any questions or concerns that you may have regarding the information given to you following your procedure. If we do not reach you, we will leave a message.  However, if you are feeling well and you are not experiencing any problems, there is no need to return our call.  We will assume that you have returned to your regular daily activities without incident.  If any biopsies were taken you will be contacted by phone or by letter within the next 1-3 weeks.  Please call us at [phone] if you have not heard about the biopsies in 3 weeks.    SIGNATURES/CONFIDENTIALITY: You and/or your care partner have signed paperwork which will be entered into your electronic medical record.  These signatures attest to the fact that that the information above on your After Visit Summary has been reviewed and is understood.  Full responsibility of the confidentiality of this discharge information lies with you and/or your care-partner. 

## 2017-10-02 NOTE — Progress Notes (Signed)
Pt's states no medical or surgical changes since previsit or office visit. 

## 2017-10-02 NOTE — Op Note (Signed)
[facility] Patient Name: Glenda GanjaBeverly Miller Procedure Date: 10/02/2017 8:58 AM MRN: [id number] Endoscopist: Meryl DareMalcolm T Wayland Baik , MD Age: 62 Referring MD:  Date of Birth: 07/10/1955 Gender: Female Account #: [id number] Procedure:                Colonoscopy Indications:              Screening for colorectal malignant neoplasm Medicines:                Monitored Anesthesia Care Procedure:                Pre-Anesthesia Assessment:                           - Prior to the procedure, a History and Physical                            was performed, and patient medications and                            allergies were reviewed. The patient's tolerance of                            previous anesthesia was also reviewed. The risks                            and benefits of the procedure and the sedation                            options and risks were discussed with the patient.                            All questions were answered, and informed consent                            was obtained. Prior Anticoagulants: The patient has                            taken no previous anticoagulant or antiplatelet                            agents. ASA Grade Assessment: II - A patient with                            mild systemic disease. After reviewing the risks                            and benefits, the patient was deemed in                            satisfactory condition to undergo the procedure.                           After obtaining informed consent, the colonoscope  was passed under direct vision. Throughout the                            procedure, the patient's blood pressure, pulse, and                            oxygen saturations were monitored continuously. The                            Colonoscope was introduced through the anus and                            advanced to the the cecum, identified by                            appendiceal orifice and  ileocecal valve. The                            ileocecal valve, appendiceal orifice, and rectum                            were photographed. The quality of the bowel                            preparation was good. The patient tolerated the                            procedure well. The colonoscopy was somewhat                            difficult due to significant looping and a tortuous                            colon. Successful completion of the procedure was                            aided by changing the patient's position, using                            manual pressure and straightening and shortening                            the scope to obtain bowel loop reduction. Scope In: 9:06:46 AM Scope Out: 9:26:48 AM Scope Withdrawal Time: 0 hours 8 minutes 0 seconds  Total Procedure Duration: 0 hours 20 minutes 2 seconds  Findings:                 The perianal and digital rectal examinations were                            normal.                           A few small-mouthed diverticula were found in the  left colon.                           Internal hemorrhoids were found during                            retroflexion. The hemorrhoids were small and Grade                            I (internal hemorrhoids that do not prolapse).                           The exam was otherwise without abnormality on                            direct and retroflexion views. Complications:            No immediate complications. Estimated blood loss:                            None. Estimated Blood Loss:     Estimated blood loss: none. Impression:               - Diverticulosis in the left colon.                           - Internal hemorrhoids.                           - The examination was otherwise normal on direct                            and retroflexion views.                           - No specimens collected. Recommendation:           - Repeat colonoscopy in  10 years for screening                            purposes.                           - Patient has a contact number available for                            emergencies. The signs and symptoms of potential                            delayed complications were discussed with the                            patient. Return to normal activities tomorrow.                            Written discharge instructions were provided to the  patient.                           - Resume previous diet.                           - Continue present medications. Meryl Dare, MD 10/02/2017 9:29:41 AM This report has been signed electronically.

## 2017-10-02 NOTE — Progress Notes (Signed)
To [facility], VSS. Report to RN.tb 

## 2017-10-06 ENCOUNTER — Telehealth: Payer: Self-pay | Admitting: *Deleted

## 2017-10-06 DIAGNOSIS — E119 Type 2 diabetes mellitus without complications: Secondary | ICD-10-CM | POA: Diagnosis not present

## 2017-10-06 NOTE — Telephone Encounter (Signed)
  Follow up Call-  Call back number 10/02/2017  Post procedure Call Back phone  # #[phone] cell  Permission to leave phone message Yes  Some recent data might be hidden     Patient questions:  Do you have a fever, pain , or abdominal swelling? Yes.   Pain Score  0 *  Have you tolerated food without any problems? Yes.    Have you been able to return to your normal activities? Yes.    Do you have any questions about your discharge instructions: Diet   No. Medications  No. Follow up visit  No.  Do you have questions or concerns about your Care? No.  Actions: * If pain score is 4 or above: No action needed, pain <4.

## 2017-10-13 DIAGNOSIS — E119 Type 2 diabetes mellitus without complications: Secondary | ICD-10-CM | POA: Diagnosis not present

## 2017-10-13 DIAGNOSIS — I10 Essential (primary) hypertension: Secondary | ICD-10-CM | POA: Diagnosis not present

## 2017-11-14 ENCOUNTER — Ambulatory Visit (HOSPITAL_COMMUNITY)
Admission: RE | Admit: 2017-11-14 | Discharge: 2017-11-14 | Disposition: A | Discharge status: Home / Self Care | Payer: Medicare Other | Source: Ambulatory Visit | Attending: Internal Medicine | Admitting: Internal Medicine

## 2017-11-14 ENCOUNTER — Other Ambulatory Visit (HOSPITAL_COMMUNITY): Payer: Self-pay | Admitting: Internal Medicine

## 2017-11-14 DIAGNOSIS — M25552 Pain in left hip: Secondary | ICD-10-CM | POA: Diagnosis not present

## 2017-11-14 DIAGNOSIS — M160 Bilateral primary osteoarthritis of hip: Secondary | ICD-10-CM | POA: Insufficient documentation

## 2017-11-14 DIAGNOSIS — M1612 Unilateral primary osteoarthritis, left hip: Secondary | ICD-10-CM | POA: Diagnosis not present

## 2017-11-15 ENCOUNTER — Emergency Department (HOSPITAL_COMMUNITY)
Admission: EM | Admit: 2017-11-15 | Discharge: 2017-11-16 | Disposition: A | Discharge status: Home / Self Care | Payer: Medicare Other | Attending: Emergency Medicine | Admitting: Emergency Medicine

## 2017-11-15 ENCOUNTER — Other Ambulatory Visit: Payer: Self-pay

## 2017-11-15 ENCOUNTER — Encounter (HOSPITAL_COMMUNITY): Payer: Self-pay | Admitting: Emergency Medicine

## 2017-11-15 DIAGNOSIS — M25552 Pain in left hip: Secondary | ICD-10-CM | POA: Insufficient documentation

## 2017-11-15 DIAGNOSIS — Z7984 Long term (current) use of oral hypoglycemic drugs: Secondary | ICD-10-CM | POA: Diagnosis not present

## 2017-11-15 DIAGNOSIS — E785 Hyperlipidemia, unspecified: Secondary | ICD-10-CM | POA: Diagnosis not present

## 2017-11-15 DIAGNOSIS — I10 Essential (primary) hypertension: Secondary | ICD-10-CM | POA: Diagnosis not present

## 2017-11-15 DIAGNOSIS — Z79899 Other long term (current) drug therapy: Secondary | ICD-10-CM | POA: Diagnosis not present

## 2017-11-15 DIAGNOSIS — E119 Type 2 diabetes mellitus without complications: Secondary | ICD-10-CM | POA: Insufficient documentation

## 2017-11-15 DIAGNOSIS — Z7982 Long term (current) use of aspirin: Secondary | ICD-10-CM | POA: Insufficient documentation

## 2017-11-15 DIAGNOSIS — M545 Low back pain: Secondary | ICD-10-CM | POA: Diagnosis present

## 2017-11-15 MED ORDER — NAPROXEN 500 MG PO TABS: 500.0000 mg | ORAL_TABLET | Freq: Two times a day (BID) | ORAL | 0 refills | Status: DC

## 2017-11-15 MED ORDER — KETOROLAC TROMETHAMINE 30 MG/ML IJ SOLN
30.0000 mg | Freq: Once | INTRAMUSCULAR | Status: AC
  Administered 2017-11-15: 30 mg via INTRAMUSCULAR
  Filled 2017-11-15: qty 1

## 2017-11-15 MED ORDER — OXYCODONE-ACETAMINOPHEN 5-325 MG PO TABS: 1.0000 | ORAL_TABLET | ORAL | 0 refills | Status: DC | PRN

## 2017-11-15 MED ORDER — OXYCODONE-ACETAMINOPHEN 5-325 MG PO TABS
1.0000 | ORAL_TABLET | Freq: Once | ORAL | Status: AC
  Administered 2017-11-15: 1 via ORAL
  Filled 2017-11-15: qty 1

## 2017-11-15 NOTE — Discharge Instructions (Addendum)
Continue to alternate ice and heat to your hip.  Follow-up with your orthopedic provider or rheumatologist next week for recheck.  Return to the ER for any worsening symptoms.

## 2017-11-15 NOTE — ED Triage Notes (Signed)
Patient complains of back pain, said she was seen at PCP office on Friday, stating she was "sent here on Friday for xrays", also stating does not know results yet.  Back pain increased beginning today.

## 2017-11-15 NOTE — ED Provider Notes (Signed)
[facility] EMERGENCY DEPARTMENT Provider Note   CSN: [id number] Arrival date & time: 11/15/17  2145     History   Chief Complaint Chief Complaint  Patient presents with  . Back Pain    HPI Glenda White is a 62 y.o. female.  HPI   Glenda White is a 62 y.o. female with hx of DM, HTN, Chronic low back pain and OA, who presents to the Emergency Department complaining of left hip and left low back pain.  She was seen by her PCP yesterday for the same, had X Rays of her left hip done here, but does not know the results.  This evening, pain became worse and she describes as constant, but increases with standing, bending and sitting in a chair.  She has not tried any therapies.  Denies abdominal pain, nausea, vomiting, urine or bowel symptoms, pain, numbness or weakness of her legs.  No known injury.  Has hx of arthritis and sees an orthopedic provider in [place].  Reports having recent "nerve block" of her lower back with some relief    Past Medical History:  Diagnosis Date  . Allergy   . Arthritis   . Cataract   . Chronic low back pain 09/16/2014  . Hyperlipidemia   . Hypertension   . Migraines   . Obstruction of right ureteropelvic junction (UPJ) due to stone   . Rotator cuff arthropathy of right shoulder   . Type 2 diabetes mellitus [facility](HCC)     Patient Active Problem List   Diagnosis Date Noted  . Chronic low back pain 09/16/2014  . Common migraine 09/16/2014    Past Surgical History:  Procedure Laterality Date  . ABDOMINAL HYSTERECTOMY  2002   w/ Unilateral Salpingo-Oophrectomy  . CESAREAN SECTION  1988  . CYST REMOVAL HAND  1980s  . CYSTOSCOPY/URETEROSCOPY/HOLMIUM LASER/STENT PLACEMENT Right 01/01/2017   Procedure: CYSTOSCOPY/ RETROGRADE/URETEROSCOPY/HOLMIUM LASER/STENT PLACEMENT;  Surgeon: Rene PaciWinter, Christopher Aaron, MD;  [facility];  Service: Urology;  Laterality: Right;  ONLY NEEDS 30 MIN FOR PROCEDURE  . ROTATOR CUFF REPAIR Left  1990s  . TONSILLECTOMY  child  . UMBILICAL HERNIA REPAIR  1970s & 1990s     OB History   None      Home Medications    Prior to Admission medications   Medication Sig Start Date End Date Taking? Authorizing Provider  aspirin EC 81 MG tablet Take 81 mg by mouth daily.    [provider]  atorvastatin (LIPITOR) 20 MG tablet Take 20 mg by mouth every morning.     [provider]  hydrochlorothiazide (HYDRODIURIL) 25 MG tablet Take 0.5 tablets by mouth every morning.  03/16/13   [provider]  metFORMIN (GLUCOPHAGE) 500 MG tablet Take 500 mg by mouth 2 (two) times daily with a meal.     [provider]  NIFEDICAL XL 30 MG 24 hr tablet Take 1 tablet by mouth every morning.  05/13/13   [provider]  OVER THE COUNTER MEDICATION Calcium with vitamin D, 600 mg daily.    [provider]  potassium chloride SA (K-DUR,KLOR-CON) 20 MEQ tablet Take 1 tablet by mouth 2 (two) times daily.  05/05/13   [provider]  topiramate (TOPAMAX) 100 MG tablet Take 1 tablet by mouth every evening.  05/05/13   [provider]    Family History Family History  Problem Relation Age of Onset  . Hypertension Mother   . Cancer - Lung Mother   .  Ovarian cancer Mother   . Hypertension Sister   . Diabetes Sister   . Hyperlipidemia Sister   . Colon cancer Neg Hx   . Liver cancer Neg Hx   . Esophageal cancer Neg Hx   . Pancreatic cancer Neg Hx   . Rectal cancer Neg Hx   . Stomach cancer Neg Hx     Social History Social History   Tobacco Use  . Smoking status: Never Smoker  . Smokeless tobacco: Never Used  Substance Use Topics  . Alcohol use: No  . Drug use: No     Allergies   Shrimp [shellfish allergy]   Review of Systems Review of Systems  Constitutional: Negative for chills and fever.  Respiratory: Negative for shortness of breath.   Cardiovascular: Negative for chest pain.  Gastrointestinal: Negative for abdominal  pain, diarrhea, nausea and vomiting.  Genitourinary: Negative for difficulty urinating and dysuria.  Musculoskeletal: Positive for arthralgias (left hip pain). Negative for back pain, joint swelling and neck pain.  Skin: Negative for color change and wound.  Neurological: Negative for dizziness, weakness and numbness.     Physical Exam Updated Vital Signs BP 137/67 (BP Location: Left Arm)   Pulse 98   Temp 98.1 F (36.7 C) (Oral)   Resp 20   Ht 5' (1.524 m)   Wt 89.8 kg   SpO2 99%   BMI 38.67 kg/m   Physical Exam  Constitutional: She is oriented to person, place, and time. She appears well-developed and well-nourished. No distress.  HENT:  Head: Atraumatic.  Mouth/Throat: Oropharynx is clear and moist.  Neck: Normal range of motion. Neck supple.  Cardiovascular: Normal rate, regular rhythm and intact distal pulses.  Pulmonary/Chest: Effort normal and breath sounds normal. No respiratory distress.  Abdominal: Soft. She exhibits no distension and no mass. There is no tenderness. There is no guarding.  Musculoskeletal: She exhibits tenderness.       Left hip: She exhibits decreased range of motion, tenderness and bony tenderness. She exhibits normal strength, no swelling, no crepitus and no deformity.       Legs: ttp of the left anterior and posterior hip.  No erythema or edema.  No rash.  Pain reproduced with external rotation.    Neurological: She is alert and oriented to person, place, and time. No sensory deficit.  Skin: Skin is warm. Capillary refill takes less than 2 seconds. No rash noted.  Nursing note and vitals reviewed.    ED Treatments / Results  Labs (all labs ordered are listed, but only abnormal results are displayed) Labs Reviewed - No data to display  EKG None  Radiology Dg Hip Unilat With Pelvis 2-3 Views Left  Result Date: 11/15/2017 CLINICAL DATA:  Patient states worsening left hip pain since Wednesday morning, no known injury. Hx of arthritis,  hysterectomy, c-section, umbilical hernia repair, and cystoscopy with stent placement. EXAM: DG HIP (WITH OR WITHOUT PELVIS) 2-3V LEFT COMPARISON:  CT 06/24/2017 FINDINGS: No fracture. No dislocation. Narrowing of the articular cartilage in both hips with marginal spurring from the femoral heads and acetabula. Bilateral pelvic enthesopathy. IMPRESSION: 1. Negative for fracture, dislocation, or other acute abnormality. 2. Bilateral hip DJD. Electronically Signed   By: Corlis Leak M.D.   On: 11/15/2017 22:09    Procedures Procedures (including critical care time)  Medications Ordered in ED Medications  ketorolac (TORADOL) 30 MG/ML injection 30 mg (30 mg Intramuscular Given 11/15/17 2224)  oxyCODONE-acetaminophen (PERCOCET/ROXICET) 5-325 MG per tablet 1 tablet (1 tablet  Oral Given 11/15/17 2223)     Initial Impression / Assessment and Plan / ED Course  I have reviewed the triage vital signs and the nursing notes.  Pertinent labs & imaging results that were available during my care of the patient were reviewed by me and considered in my medical decision making (see chart for details).     2340  On recheck, pt is feeling better.  Pain much improved.  Remains NV intact. No concerning sx's for septic joint.  normal BUN and creatinine 4 months ago, discussed XR findings and pt agrees to arrange f/u with her orthopedic provider.  Will start patient on brief course of NSAID  Final Clinical Impressions(s) / ED Diagnoses   Final diagnoses:  Pain in left hip    ED Discharge Orders    None       Rosey Bath 11/15/17 2357    Loren Racer, MD 11/16/17 2307

## 2017-11-15 NOTE — ED Notes (Signed)
Pt reports back pain Sent to Rad yesterday by Dr Ouida SillsFagan Had Xrays, but has no results   Call to Rad to ascertain if they could get Rad reading on yesterday films  Pt to stretcher, crying out   To back via wheelchair

## 2017-11-18 MED FILL — Oxycodone w/ Acetaminophen Tab 5-325 MG: ORAL | Qty: 6 | Status: AC

## 2017-11-19 DIAGNOSIS — M5136 Other intervertebral disc degeneration, lumbar region: Secondary | ICD-10-CM | POA: Diagnosis not present

## 2017-11-19 DIAGNOSIS — M150 Primary generalized (osteo)arthritis: Secondary | ICD-10-CM | POA: Diagnosis not present

## 2017-12-10 ENCOUNTER — Encounter (INDEPENDENT_AMBULATORY_CARE_PROVIDER_SITE_OTHER): Payer: Self-pay | Admitting: Orthopaedic Surgery

## 2017-12-10 ENCOUNTER — Ambulatory Visit (INDEPENDENT_AMBULATORY_CARE_PROVIDER_SITE_OTHER): Payer: Medicare Other | Admitting: Orthopaedic Surgery

## 2017-12-10 VITALS — BP 135/79 | HR 81 | Ht 60.0 in | Wt 198.0 lb

## 2017-12-10 DIAGNOSIS — M25552 Pain in left hip: Secondary | ICD-10-CM | POA: Diagnosis not present

## 2017-12-10 NOTE — Progress Notes (Signed)
Office Visit Note   Patient: Glenda White           Date of Birth: Feb 19, 1956           MRN: [id number] Visit Date: 12/10/2017              Requested by: Carylon Perches, MD [address] [place], [place] [address] PCP: Carylon Perches, MD   Assessment & Plan: Visit Diagnoses:  1. Pain of left hip joint     Plan: Films demonstrate bilateral hip osteoarthritis.  Mrs. Glenda White pain appears to originate from her lumbar spine.  The 2 "flareups" that she is experienced in the past 6 months appear to originate from the lumbosacral junction.  Although she does have arthritis in both of her hips I do not think that is really causing most of her pain.  I have had a long discussion over 45 minutes 50% of the time in counseling regarding her diagnosis and treatment options.  She does see Dr.Beekman for her arthritis.  She is been followed by Dr. Ollen Bowl for her back.  I think the problem with her back is causing the most trouble.  I had offered her intra-articular cortisone injection of her left hip but I do not think that will help with the posterior discomfort particularly in the left side of lumbosacral junction.  We will plan to see back as needed.  She can call if she continues to have any groin pain.  I do not think that is really causing most of her problem and she does not limp. In summary Glenda White has a combination of factors causing her pain.  I believe the majority of her pain originates from the lumbar spine.  She does have some bilateral hip arthritis and she is more symptomatic on the left than the right.  She will check with Dr. Ouida Sills to be sure she is okay taking NSAIDs.  Or with Dr. Dierdre Forth.  We can always consider an intra-articular cortisone injection of the left hip over time but I believe the majority of her discomfort originates from her back  Follow-Up Instructions: Return if symptoms worsen or fail to improve.   Orders:  No orders of the defined types were placed in this  encounter.  No orders of the defined types were placed in this encounter.     Procedures: No notes on file   Clinical Data: No additional findings.   Subjective: Chief Complaint  Patient presents with  . New Patient (Initial Visit)    L HIP PAIN NO INJURY HAS HAD 6 INJECTIONS IN BACK 09/29/2017 FOR BACK PAIN WHICH HAS HEPED BACK. 11/14/17 WENT TO ER LEFT HIP AND WAS GIVEN PAIN MEDS  Glenda White is a 62 years old visited the office for evaluation of "left hip pain.  She is been seeing Dr. Ollen Bowl in [place] for approximately a year for problems referable to her back.  In July she had several "nerve injections in her lumbar spine that "helped".  She has had 2 episodes of severe pain radiating from her back to 1 of the other hip in the past 6 months.  She was seen in the emergency room in April for evaluation of back pain and right hip pain.  She was treated with medicines with resolution.  The end of August she had a second "flareup" of pain in her back left hip and was treated successfully with medicines.  She presently notes that she has a lot of trouble  with her back lumbar region when she stands to cook or wash dishes.  She has not had any numbness or tingling.  She is had some mild pain along the lateral aspect of her left hip.  She does see Dr. Dierdre Forth as a rheumatologist for evaluation of her arthritis.  She might have rheumatoid arthritis.  She had an MRI scan of the lumbar spine in 2016 by Dr. Ouida Sills.  She had bilateral facet arthropathy with 2 mm of anterior listhesis at L3-4.  No compressive stenosis.  At L4-5 she had bilateral facet arthropathy with mild edema.  No compressive stenosis.  He had an MRI scan of her right hip in 2015 revealing a right hip effusion consistent with nonspecific synovitis.  There was mild to moderate right hip arthritis.  AP the pelvis was performed at the end of August demonstrating bilateral hip arthritis.  HPI  Review of Systems   Objective: Vital  Signs: BP 135/79 (BP Location: Left Arm, Patient Position: Sitting, Cuff Size: Normal)   Pulse 81   Ht 5' (1.524 m)   Wt 198 lb (89.8 kg)   BMI 38.67 kg/m   Physical Exam  Constitutional: She is oriented to person, place, and time. She appears well-developed and well-nourished.  HENT:  Mouth/Throat: Oropharynx is clear and moist.  Eyes: Pupils are equal, round, and reactive to light. EOM are normal.  Pulmonary/Chest: Effort normal.  Neurological: She is alert and oriented to person, place, and time.  Skin: Skin is warm and dry.  Psychiatric: She has a normal mood and affect. Her behavior is normal.    Ortho Exam awake alert and oriented x3.  Comfortable sitting.  Some pain with internal/external rotation of the left hip with mild decreased motion compared to the right hip.  Straight leg raise negative.  Neurovascular exam intact.  Some percussible tenderness of the lumbar spine and in the paralumbar region more on the left than the right.  Specialty Comments:  No specialty comments available.  Imaging: No results found.   PMFS History:  Patient Active Problem List   Diagnosis Date Noted  . Chronic low back pain 09/16/2014  . Common migraine 09/16/2014   Past Medical History:  Diagnosis Date  . Allergy   . Arthritis   . Cataract   . Chronic low back pain 09/16/2014  . Hyperlipidemia   . Hypertension   . Migraines   . Obstruction of right ureteropelvic junction (UPJ) due to stone   . Rotator cuff arthropathy of right shoulder   . Type 2 diabetes mellitus (HCC)     Family History  Problem Relation Age of Onset  . Hypertension Mother   . Cancer - Lung Mother   . Ovarian cancer Mother   . Hypertension Sister   . Diabetes Sister   . Hyperlipidemia Sister   . Colon cancer Neg Hx   . Liver cancer Neg Hx   . Esophageal cancer Neg Hx   . Pancreatic cancer Neg Hx   . Rectal cancer Neg Hx   . Stomach cancer Neg Hx     Past Surgical History:  Procedure Laterality Date    . ABDOMINAL HYSTERECTOMY  2002   w/ Unilateral Salpingo-Oophrectomy  . CESAREAN SECTION  1988  . CYST REMOVAL HAND  1980s  . CYSTOSCOPY/URETEROSCOPY/HOLMIUM LASER/STENT PLACEMENT Right 01/01/2017   Procedure: CYSTOSCOPY/ RETROGRADE/URETEROSCOPY/HOLMIUM LASER/STENT PLACEMENT;  Surgeon: Rene Paci, MD;  Location: [facility];  Service: Urology;  Laterality: Right;  ONLY NEEDS 30 MIN  FOR PROCEDURE  . ROTATOR CUFF REPAIR Left 1990s  . TONSILLECTOMY  child  . UMBILICAL HERNIA REPAIR  1970s & 1990s   Social History   Occupational History  . Occupation: unemployed  Tobacco Use  . Smoking status: Never Smoker  . Smokeless tobacco: Never Used  Substance and Sexual Activity  . Alcohol use: No  . Drug use: No  . Sexual activity: Not on file

## 2017-12-10 NOTE — Progress Notes (Deleted)
Office Visit Note   Patient: Glenda White           Date of Birth: 1955-12-31           MRN: [id number] Visit Date: 12/10/2017              Requested by: Carylon Perches, MD [address] [place], [place] [address] PCP: Carylon Perches, MD   Assessment & Plan: Visit Diagnoses: No diagnosis found.  Plan: ***  Follow-Up Instructions: No follow-ups on file.   Orders:  No orders of the defined types were placed in this encounter.  No orders of the defined types were placed in this encounter.     Procedures: No procedures performed   Clinical Data: No additional findings.   Subjective: Chief Complaint  Patient presents with  . New Patient (Initial Visit)    L HIP PAIN NO INJURY HAS HAD 6 INJECTIONS IN BACK 09/29/2017 FOR BACK PAIN WHICH HAS HEPED BACK. 11/14/17 WENT TO ER LEFT HIP AND WAS GIVEN PAIN MEDS    HPI  Review of Systems  Constitutional: Negative for fatigue and fever.  HENT: Negative for ear pain.   Eyes: Negative for pain.  Respiratory: Negative for cough and shortness of breath.   Cardiovascular: Negative for leg swelling.  Gastrointestinal: Negative for constipation and diarrhea.  Genitourinary: Negative for difficulty urinating.  Musculoskeletal: Positive for back pain. Negative for neck pain.  Skin: Negative for rash.  Allergic/Immunologic: Positive for food allergies.  Neurological: Positive for weakness. Negative for numbness.  Hematological: Does not bruise/bleed easily.  Psychiatric/Behavioral: Positive for sleep disturbance.     Objective: Vital Signs: There were no vitals taken for this visit.  Physical Exam  Ortho Exam  Specialty Comments:  No specialty comments available.  Imaging: No results found.   PMFS History: Patient Active Problem List   Diagnosis Date Noted  . Chronic low back pain 09/16/2014  . Common migraine 09/16/2014   Past Medical History:  Diagnosis Date  . Allergy   . Arthritis   . Cataract   .  Chronic low back pain 09/16/2014  . Hyperlipidemia   . Hypertension   . Migraines   . Obstruction of right ureteropelvic junction (UPJ) due to stone   . Rotator cuff arthropathy of right shoulder   . Type 2 diabetes mellitus (HCC)     Family History  Problem Relation Age of Onset  . Hypertension Mother   . Cancer - Lung Mother   . Ovarian cancer Mother   . Hypertension Sister   . Diabetes Sister   . Hyperlipidemia Sister   . Colon cancer Neg Hx   . Liver cancer Neg Hx   . Esophageal cancer Neg Hx   . Pancreatic cancer Neg Hx   . Rectal cancer Neg Hx   . Stomach cancer Neg Hx     Past Surgical History:  Procedure Laterality Date  . ABDOMINAL HYSTERECTOMY  2002   w/ Unilateral Salpingo-Oophrectomy  . CESAREAN SECTION  1988  . CYST REMOVAL HAND  1980s  . CYSTOSCOPY/URETEROSCOPY/HOLMIUM LASER/STENT PLACEMENT Right 01/01/2017   Procedure: CYSTOSCOPY/ RETROGRADE/URETEROSCOPY/HOLMIUM LASER/STENT PLACEMENT;  Surgeon: Rene Paci, MD;  Location: [facility];  Service: Urology;  Laterality: Right;  ONLY NEEDS 30 MIN FOR PROCEDURE  . ROTATOR CUFF REPAIR Left 1990s  . TONSILLECTOMY  child  . UMBILICAL HERNIA REPAIR  1970s & 1990s   Social History   Occupational History  . Occupation: unemployed  Tobacco Use  .  Smoking status: Never Smoker  . Smokeless tobacco: Never Used  Substance and Sexual Activity  . Alcohol use: No  . Drug use: No  . Sexual activity: Not on file

## 2017-12-24 ENCOUNTER — Telehealth: Payer: Self-pay | Admitting: Rheumatology

## 2017-12-24 DIAGNOSIS — I10 Essential (primary) hypertension: Secondary | ICD-10-CM | POA: Diagnosis not present

## 2017-12-24 DIAGNOSIS — M47816 Spondylosis without myelopathy or radiculopathy, lumbar region: Secondary | ICD-10-CM | POA: Diagnosis not present

## 2017-12-24 DIAGNOSIS — M25552 Pain in left hip: Secondary | ICD-10-CM | POA: Diagnosis not present

## 2017-12-24 NOTE — Telephone Encounter (Signed)
Rosey Bath from Dr. Juanetta Gosling office at [facility] called requesting patient's notes from 12/10/17 appointment be faxed to [phone].   If you have any questions, please call back at #[phone]

## 2017-12-25 NOTE — Telephone Encounter (Signed)
Office note faxed to Dr. Juanetta Gosling office

## 2018-01-26 DIAGNOSIS — I10 Essential (primary) hypertension: Secondary | ICD-10-CM | POA: Diagnosis not present

## 2018-01-26 DIAGNOSIS — Z79899 Other long term (current) drug therapy: Secondary | ICD-10-CM | POA: Diagnosis not present

## 2018-01-26 DIAGNOSIS — E119 Type 2 diabetes mellitus without complications: Secondary | ICD-10-CM | POA: Diagnosis not present

## 2018-02-02 DIAGNOSIS — Z23 Encounter for immunization: Secondary | ICD-10-CM | POA: Diagnosis not present

## 2018-02-02 DIAGNOSIS — E1159 Type 2 diabetes mellitus with other circulatory complications: Secondary | ICD-10-CM | POA: Diagnosis not present

## 2018-02-02 DIAGNOSIS — M5416 Radiculopathy, lumbar region: Secondary | ICD-10-CM | POA: Diagnosis not present

## 2018-02-02 DIAGNOSIS — E876 Hypokalemia: Secondary | ICD-10-CM | POA: Diagnosis not present

## 2018-02-02 DIAGNOSIS — I10 Essential (primary) hypertension: Secondary | ICD-10-CM | POA: Diagnosis not present

## 2018-02-02 DIAGNOSIS — E785 Hyperlipidemia, unspecified: Secondary | ICD-10-CM | POA: Diagnosis not present

## 2018-02-03 ENCOUNTER — Other Ambulatory Visit (HOSPITAL_COMMUNITY): Payer: Self-pay | Admitting: Obstetrics and Gynecology

## 2018-02-03 DIAGNOSIS — Z1231 Encounter for screening mammogram for malignant neoplasm of breast: Secondary | ICD-10-CM

## 2018-02-04 ENCOUNTER — Ambulatory Visit (HOSPITAL_COMMUNITY)
Admission: RE | Admit: 2018-02-04 | Discharge: 2018-02-04 | Disposition: A | Discharge status: Home / Self Care | Payer: Medicare Other | Source: Ambulatory Visit | Attending: Obstetrics and Gynecology | Admitting: Obstetrics and Gynecology

## 2018-02-04 ENCOUNTER — Encounter (HOSPITAL_COMMUNITY): Payer: Self-pay

## 2018-02-04 DIAGNOSIS — Z1231 Encounter for screening mammogram for malignant neoplasm of breast: Secondary | ICD-10-CM | POA: Insufficient documentation

## 2018-03-06 DIAGNOSIS — Z87442 Personal history of urinary calculi: Secondary | ICD-10-CM | POA: Diagnosis not present

## 2018-03-17 DIAGNOSIS — Z87442 Personal history of urinary calculi: Secondary | ICD-10-CM | POA: Diagnosis not present

## 2018-03-23 DIAGNOSIS — M25552 Pain in left hip: Secondary | ICD-10-CM | POA: Diagnosis not present

## 2018-04-28 DIAGNOSIS — M25552 Pain in left hip: Secondary | ICD-10-CM | POA: Diagnosis not present

## 2018-04-28 DIAGNOSIS — I10 Essential (primary) hypertension: Secondary | ICD-10-CM | POA: Diagnosis not present

## 2018-04-28 DIAGNOSIS — M5416 Radiculopathy, lumbar region: Secondary | ICD-10-CM | POA: Diagnosis not present

## 2018-05-26 DIAGNOSIS — M79672 Pain in left foot: Secondary | ICD-10-CM | POA: Diagnosis not present

## 2018-05-26 DIAGNOSIS — I739 Peripheral vascular disease, unspecified: Secondary | ICD-10-CM | POA: Diagnosis not present

## 2018-05-26 DIAGNOSIS — E1140 Type 2 diabetes mellitus with diabetic neuropathy, unspecified: Secondary | ICD-10-CM | POA: Diagnosis not present

## 2018-05-26 DIAGNOSIS — M79671 Pain in right foot: Secondary | ICD-10-CM | POA: Diagnosis not present

## 2018-05-27 DIAGNOSIS — E876 Hypokalemia: Secondary | ICD-10-CM | POA: Diagnosis not present

## 2018-05-27 DIAGNOSIS — E1159 Type 2 diabetes mellitus with other circulatory complications: Secondary | ICD-10-CM | POA: Diagnosis not present

## 2018-06-03 DIAGNOSIS — E876 Hypokalemia: Secondary | ICD-10-CM | POA: Diagnosis not present

## 2018-06-03 DIAGNOSIS — E1159 Type 2 diabetes mellitus with other circulatory complications: Secondary | ICD-10-CM | POA: Diagnosis not present

## 2018-06-04 DIAGNOSIS — R3915 Urgency of urination: Secondary | ICD-10-CM | POA: Diagnosis not present

## 2018-06-04 DIAGNOSIS — Z87442 Personal history of urinary calculi: Secondary | ICD-10-CM | POA: Diagnosis not present

## 2018-07-21 DIAGNOSIS — M25552 Pain in left hip: Secondary | ICD-10-CM | POA: Diagnosis not present

## 2018-08-24 DIAGNOSIS — M5136 Other intervertebral disc degeneration, lumbar region: Secondary | ICD-10-CM | POA: Diagnosis not present

## 2018-08-24 DIAGNOSIS — M5416 Radiculopathy, lumbar region: Secondary | ICD-10-CM | POA: Diagnosis not present

## 2018-08-25 DIAGNOSIS — L600 Ingrowing nail: Secondary | ICD-10-CM | POA: Diagnosis not present

## 2018-08-25 DIAGNOSIS — I739 Peripheral vascular disease, unspecified: Secondary | ICD-10-CM | POA: Diagnosis not present

## 2018-08-25 DIAGNOSIS — E1140 Type 2 diabetes mellitus with diabetic neuropathy, unspecified: Secondary | ICD-10-CM | POA: Diagnosis not present

## 2018-08-25 DIAGNOSIS — M79671 Pain in right foot: Secondary | ICD-10-CM | POA: Diagnosis not present

## 2018-08-25 DIAGNOSIS — E1151 Type 2 diabetes mellitus with diabetic peripheral angiopathy without gangrene: Secondary | ICD-10-CM | POA: Diagnosis not present

## 2018-09-04 DIAGNOSIS — E119 Type 2 diabetes mellitus without complications: Secondary | ICD-10-CM | POA: Diagnosis not present

## 2018-09-04 DIAGNOSIS — H25813 Combined forms of age-related cataract, bilateral: Secondary | ICD-10-CM | POA: Diagnosis not present

## 2018-09-04 DIAGNOSIS — H524 Presbyopia: Secondary | ICD-10-CM | POA: Diagnosis not present

## 2018-09-04 DIAGNOSIS — H5213 Myopia, bilateral: Secondary | ICD-10-CM | POA: Diagnosis not present

## 2018-09-04 DIAGNOSIS — H52203 Unspecified astigmatism, bilateral: Secondary | ICD-10-CM | POA: Diagnosis not present

## 2018-09-29 DIAGNOSIS — E1159 Type 2 diabetes mellitus with other circulatory complications: Secondary | ICD-10-CM | POA: Diagnosis not present

## 2018-10-06 DIAGNOSIS — E1159 Type 2 diabetes mellitus with other circulatory complications: Secondary | ICD-10-CM | POA: Diagnosis not present

## 2018-10-06 DIAGNOSIS — I10 Essential (primary) hypertension: Secondary | ICD-10-CM | POA: Diagnosis not present

## 2018-10-08 DIAGNOSIS — M5416 Radiculopathy, lumbar region: Secondary | ICD-10-CM | POA: Diagnosis not present

## 2018-11-03 DIAGNOSIS — L110 Acquired keratosis follicularis: Secondary | ICD-10-CM | POA: Diagnosis not present

## 2018-11-03 DIAGNOSIS — E1140 Type 2 diabetes mellitus with diabetic neuropathy, unspecified: Secondary | ICD-10-CM | POA: Diagnosis not present

## 2018-11-03 DIAGNOSIS — M79671 Pain in right foot: Secondary | ICD-10-CM | POA: Diagnosis not present

## 2018-11-03 DIAGNOSIS — M79672 Pain in left foot: Secondary | ICD-10-CM | POA: Diagnosis not present

## 2018-11-10 DIAGNOSIS — M5136 Other intervertebral disc degeneration, lumbar region: Secondary | ICD-10-CM | POA: Diagnosis not present

## 2018-11-10 DIAGNOSIS — M47816 Spondylosis without myelopathy or radiculopathy, lumbar region: Secondary | ICD-10-CM | POA: Diagnosis not present

## 2019-01-11 DIAGNOSIS — M47816 Spondylosis without myelopathy or radiculopathy, lumbar region: Secondary | ICD-10-CM | POA: Diagnosis not present

## 2019-01-21 DIAGNOSIS — M25571 Pain in right ankle and joints of right foot: Secondary | ICD-10-CM | POA: Diagnosis not present

## 2019-02-02 DIAGNOSIS — M79671 Pain in right foot: Secondary | ICD-10-CM | POA: Diagnosis not present

## 2019-02-02 DIAGNOSIS — M79672 Pain in left foot: Secondary | ICD-10-CM | POA: Diagnosis not present

## 2019-02-02 DIAGNOSIS — I739 Peripheral vascular disease, unspecified: Secondary | ICD-10-CM | POA: Diagnosis not present

## 2019-02-04 DIAGNOSIS — I10 Essential (primary) hypertension: Secondary | ICD-10-CM | POA: Diagnosis not present

## 2019-02-04 DIAGNOSIS — E1159 Type 2 diabetes mellitus with other circulatory complications: Secondary | ICD-10-CM | POA: Diagnosis not present

## 2019-02-08 ENCOUNTER — Other Ambulatory Visit (HOSPITAL_COMMUNITY): Payer: Self-pay | Admitting: Obstetrics and Gynecology

## 2019-02-08 ENCOUNTER — Ambulatory Visit (HOSPITAL_COMMUNITY)
Admission: RE | Admit: 2019-02-08 | Discharge: 2019-02-08 | Disposition: A | Discharge status: Home / Self Care | Payer: Medicare Other | Source: Ambulatory Visit | Attending: Obstetrics and Gynecology | Admitting: Obstetrics and Gynecology

## 2019-02-08 ENCOUNTER — Other Ambulatory Visit: Payer: Self-pay

## 2019-02-08 DIAGNOSIS — M79671 Pain in right foot: Secondary | ICD-10-CM | POA: Diagnosis not present

## 2019-02-08 DIAGNOSIS — Z1231 Encounter for screening mammogram for malignant neoplasm of breast: Secondary | ICD-10-CM

## 2019-02-08 DIAGNOSIS — G5761 Lesion of plantar nerve, right lower limb: Secondary | ICD-10-CM | POA: Diagnosis not present

## 2019-02-15 DIAGNOSIS — I10 Essential (primary) hypertension: Secondary | ICD-10-CM | POA: Diagnosis not present

## 2019-02-15 DIAGNOSIS — M5416 Radiculopathy, lumbar region: Secondary | ICD-10-CM | POA: Diagnosis not present

## 2019-02-15 DIAGNOSIS — M47816 Spondylosis without myelopathy or radiculopathy, lumbar region: Secondary | ICD-10-CM | POA: Diagnosis not present

## 2019-03-03 DIAGNOSIS — M10061 Idiopathic gout, right knee: Secondary | ICD-10-CM | POA: Diagnosis not present

## 2019-03-08 DIAGNOSIS — G5761 Lesion of plantar nerve, right lower limb: Secondary | ICD-10-CM | POA: Diagnosis not present

## 2019-03-08 DIAGNOSIS — M79671 Pain in right foot: Secondary | ICD-10-CM | POA: Diagnosis not present

## 2019-03-29 DIAGNOSIS — M79671 Pain in right foot: Secondary | ICD-10-CM | POA: Diagnosis not present

## 2019-03-29 DIAGNOSIS — G5761 Lesion of plantar nerve, right lower limb: Secondary | ICD-10-CM | POA: Diagnosis not present

## 2019-04-15 DIAGNOSIS — M2550 Pain in unspecified joint: Secondary | ICD-10-CM | POA: Diagnosis not present

## 2019-04-15 DIAGNOSIS — M25562 Pain in left knee: Secondary | ICD-10-CM | POA: Diagnosis not present

## 2019-04-15 DIAGNOSIS — M150 Primary generalized (osteo)arthritis: Secondary | ICD-10-CM | POA: Diagnosis not present

## 2019-04-15 DIAGNOSIS — M25571 Pain in right ankle and joints of right foot: Secondary | ICD-10-CM | POA: Diagnosis not present

## 2019-04-15 DIAGNOSIS — M5136 Other intervertebral disc degeneration, lumbar region: Secondary | ICD-10-CM | POA: Diagnosis not present

## 2019-04-20 DIAGNOSIS — I739 Peripheral vascular disease, unspecified: Secondary | ICD-10-CM | POA: Diagnosis not present

## 2019-04-20 DIAGNOSIS — E1140 Type 2 diabetes mellitus with diabetic neuropathy, unspecified: Secondary | ICD-10-CM | POA: Diagnosis not present

## 2019-04-20 DIAGNOSIS — M79671 Pain in right foot: Secondary | ICD-10-CM | POA: Diagnosis not present

## 2019-04-20 DIAGNOSIS — M79672 Pain in left foot: Secondary | ICD-10-CM | POA: Diagnosis not present

## 2019-04-20 DIAGNOSIS — L110 Acquired keratosis follicularis: Secondary | ICD-10-CM | POA: Diagnosis not present

## 2019-04-26 ENCOUNTER — Other Ambulatory Visit: Payer: Self-pay | Admitting: Internal Medicine

## 2019-04-26 DIAGNOSIS — M25562 Pain in left knee: Secondary | ICD-10-CM

## 2019-05-06 ENCOUNTER — Ambulatory Visit
Admission: RE | Admit: 2019-05-06 | Discharge: 2019-05-06 | Disposition: A | Discharge status: Home / Self Care | Payer: Medicare Other | Source: Ambulatory Visit | Attending: Internal Medicine | Admitting: Internal Medicine

## 2019-05-06 ENCOUNTER — Other Ambulatory Visit: Payer: Medicare Other

## 2019-05-06 DIAGNOSIS — M25562 Pain in left knee: Secondary | ICD-10-CM

## 2019-05-06 DIAGNOSIS — M25462 Effusion, left knee: Secondary | ICD-10-CM | POA: Diagnosis not present

## 2019-05-17 DIAGNOSIS — M150 Primary generalized (osteo)arthritis: Secondary | ICD-10-CM | POA: Diagnosis not present

## 2019-05-17 DIAGNOSIS — M2550 Pain in unspecified joint: Secondary | ICD-10-CM | POA: Diagnosis not present

## 2019-05-17 DIAGNOSIS — M25571 Pain in right ankle and joints of right foot: Secondary | ICD-10-CM | POA: Diagnosis not present

## 2019-05-17 DIAGNOSIS — M5136 Other intervertebral disc degeneration, lumbar region: Secondary | ICD-10-CM | POA: Diagnosis not present

## 2019-05-17 DIAGNOSIS — M25562 Pain in left knee: Secondary | ICD-10-CM | POA: Diagnosis not present

## 2019-05-31 DIAGNOSIS — E1159 Type 2 diabetes mellitus with other circulatory complications: Secondary | ICD-10-CM | POA: Diagnosis not present

## 2019-05-31 DIAGNOSIS — Z79899 Other long term (current) drug therapy: Secondary | ICD-10-CM | POA: Diagnosis not present

## 2019-05-31 DIAGNOSIS — I10 Essential (primary) hypertension: Secondary | ICD-10-CM | POA: Diagnosis not present

## 2019-06-03 DIAGNOSIS — N201 Calculus of ureter: Secondary | ICD-10-CM | POA: Diagnosis not present

## 2019-06-03 DIAGNOSIS — R3915 Urgency of urination: Secondary | ICD-10-CM | POA: Diagnosis not present

## 2019-06-07 DIAGNOSIS — I10 Essential (primary) hypertension: Secondary | ICD-10-CM | POA: Diagnosis not present

## 2019-06-07 DIAGNOSIS — G43909 Migraine, unspecified, not intractable, without status migrainosus: Secondary | ICD-10-CM | POA: Diagnosis not present

## 2019-06-07 DIAGNOSIS — E785 Hyperlipidemia, unspecified: Secondary | ICD-10-CM | POA: Diagnosis not present

## 2019-06-07 DIAGNOSIS — E1159 Type 2 diabetes mellitus with other circulatory complications: Secondary | ICD-10-CM | POA: Diagnosis not present

## 2019-06-14 DIAGNOSIS — Z23 Encounter for immunization: Secondary | ICD-10-CM | POA: Diagnosis not present

## 2019-07-06 DIAGNOSIS — I739 Peripheral vascular disease, unspecified: Secondary | ICD-10-CM | POA: Diagnosis not present

## 2019-07-06 DIAGNOSIS — E1140 Type 2 diabetes mellitus with diabetic neuropathy, unspecified: Secondary | ICD-10-CM | POA: Diagnosis not present

## 2019-07-06 DIAGNOSIS — M79671 Pain in right foot: Secondary | ICD-10-CM | POA: Diagnosis not present

## 2019-07-06 DIAGNOSIS — L110 Acquired keratosis follicularis: Secondary | ICD-10-CM | POA: Diagnosis not present

## 2019-07-06 DIAGNOSIS — M79672 Pain in left foot: Secondary | ICD-10-CM | POA: Diagnosis not present

## 2019-09-07 DIAGNOSIS — H25813 Combined forms of age-related cataract, bilateral: Secondary | ICD-10-CM | POA: Diagnosis not present

## 2019-09-07 DIAGNOSIS — H52203 Unspecified astigmatism, bilateral: Secondary | ICD-10-CM | POA: Diagnosis not present

## 2019-09-07 DIAGNOSIS — H5213 Myopia, bilateral: Secondary | ICD-10-CM | POA: Diagnosis not present

## 2019-09-07 DIAGNOSIS — E1136 Type 2 diabetes mellitus with diabetic cataract: Secondary | ICD-10-CM | POA: Diagnosis not present

## 2019-09-07 DIAGNOSIS — H02824 Cysts of left upper eyelid: Secondary | ICD-10-CM | POA: Diagnosis not present

## 2019-09-23 DIAGNOSIS — M25561 Pain in right knee: Secondary | ICD-10-CM | POA: Diagnosis not present

## 2019-09-28 DIAGNOSIS — I739 Peripheral vascular disease, unspecified: Secondary | ICD-10-CM | POA: Diagnosis not present

## 2019-09-28 DIAGNOSIS — E1140 Type 2 diabetes mellitus with diabetic neuropathy, unspecified: Secondary | ICD-10-CM | POA: Diagnosis not present

## 2019-09-28 DIAGNOSIS — M79672 Pain in left foot: Secondary | ICD-10-CM | POA: Diagnosis not present

## 2019-09-28 DIAGNOSIS — M79671 Pain in right foot: Secondary | ICD-10-CM | POA: Diagnosis not present

## 2019-09-28 DIAGNOSIS — L110 Acquired keratosis follicularis: Secondary | ICD-10-CM | POA: Diagnosis not present

## 2019-09-30 DIAGNOSIS — E1159 Type 2 diabetes mellitus with other circulatory complications: Secondary | ICD-10-CM | POA: Diagnosis not present

## 2019-10-04 DIAGNOSIS — I10 Essential (primary) hypertension: Secondary | ICD-10-CM | POA: Diagnosis not present

## 2019-10-04 DIAGNOSIS — J019 Acute sinusitis, unspecified: Secondary | ICD-10-CM | POA: Diagnosis not present

## 2019-10-04 DIAGNOSIS — E1159 Type 2 diabetes mellitus with other circulatory complications: Secondary | ICD-10-CM | POA: Diagnosis not present

## 2019-10-11 DIAGNOSIS — M1000 Idiopathic gout, unspecified site: Secondary | ICD-10-CM | POA: Diagnosis not present

## 2019-11-15 DIAGNOSIS — M7662 Achilles tendinitis, left leg: Secondary | ICD-10-CM | POA: Diagnosis not present

## 2019-12-07 DIAGNOSIS — E1140 Type 2 diabetes mellitus with diabetic neuropathy, unspecified: Secondary | ICD-10-CM | POA: Diagnosis not present

## 2019-12-07 DIAGNOSIS — M79671 Pain in right foot: Secondary | ICD-10-CM | POA: Diagnosis not present

## 2019-12-07 DIAGNOSIS — M79672 Pain in left foot: Secondary | ICD-10-CM | POA: Diagnosis not present

## 2019-12-07 DIAGNOSIS — I739 Peripheral vascular disease, unspecified: Secondary | ICD-10-CM | POA: Diagnosis not present

## 2019-12-07 DIAGNOSIS — L110 Acquired keratosis follicularis: Secondary | ICD-10-CM | POA: Diagnosis not present

## 2020-02-01 DIAGNOSIS — E1159 Type 2 diabetes mellitus with other circulatory complications: Secondary | ICD-10-CM | POA: Diagnosis not present

## 2020-02-01 DIAGNOSIS — Z79899 Other long term (current) drug therapy: Secondary | ICD-10-CM | POA: Diagnosis not present

## 2020-02-07 DIAGNOSIS — E1100 Type 2 diabetes mellitus with hyperosmolarity without nonketotic hyperglycemic-hyperosmolar coma (NKHHC): Secondary | ICD-10-CM | POA: Diagnosis not present

## 2020-02-07 DIAGNOSIS — R7309 Other abnormal glucose: Secondary | ICD-10-CM | POA: Diagnosis not present

## 2020-02-07 DIAGNOSIS — I10 Essential (primary) hypertension: Secondary | ICD-10-CM | POA: Diagnosis not present

## 2020-02-18 DIAGNOSIS — M1611 Unilateral primary osteoarthritis, right hip: Secondary | ICD-10-CM | POA: Diagnosis not present

## 2020-02-18 DIAGNOSIS — M25571 Pain in right ankle and joints of right foot: Secondary | ICD-10-CM | POA: Diagnosis not present

## 2020-02-18 DIAGNOSIS — M545 Low back pain, unspecified: Secondary | ICD-10-CM | POA: Diagnosis not present

## 2020-02-22 DIAGNOSIS — M79671 Pain in right foot: Secondary | ICD-10-CM | POA: Diagnosis not present

## 2020-02-22 DIAGNOSIS — I739 Peripheral vascular disease, unspecified: Secondary | ICD-10-CM | POA: Diagnosis not present

## 2020-02-22 DIAGNOSIS — E1140 Type 2 diabetes mellitus with diabetic neuropathy, unspecified: Secondary | ICD-10-CM | POA: Diagnosis not present

## 2020-02-22 DIAGNOSIS — L110 Acquired keratosis follicularis: Secondary | ICD-10-CM | POA: Diagnosis not present

## 2020-02-22 DIAGNOSIS — M79672 Pain in left foot: Secondary | ICD-10-CM | POA: Diagnosis not present

## 2020-02-28 DIAGNOSIS — M1611 Unilateral primary osteoarthritis, right hip: Secondary | ICD-10-CM | POA: Diagnosis not present

## 2020-03-13 DIAGNOSIS — J019 Acute sinusitis, unspecified: Secondary | ICD-10-CM | POA: Diagnosis not present

## 2020-03-24 ENCOUNTER — Other Ambulatory Visit (HOSPITAL_COMMUNITY): Payer: Self-pay | Admitting: Internal Medicine

## 2020-03-24 DIAGNOSIS — Z1231 Encounter for screening mammogram for malignant neoplasm of breast: Secondary | ICD-10-CM

## 2020-03-27 ENCOUNTER — Ambulatory Visit (HOSPITAL_COMMUNITY)
Admission: RE | Admit: 2020-03-27 | Discharge: 2020-03-27 | Disposition: A | Discharge status: Home / Self Care | Payer: Medicare Other | Source: Ambulatory Visit | Attending: Internal Medicine | Admitting: Internal Medicine

## 2020-03-27 ENCOUNTER — Other Ambulatory Visit: Payer: Self-pay

## 2020-03-27 DIAGNOSIS — Z1231 Encounter for screening mammogram for malignant neoplasm of breast: Secondary | ICD-10-CM | POA: Insufficient documentation

## 2020-04-06 DIAGNOSIS — Z23 Encounter for immunization: Secondary | ICD-10-CM | POA: Diagnosis not present

## 2020-05-02 DIAGNOSIS — I10 Essential (primary) hypertension: Secondary | ICD-10-CM | POA: Diagnosis not present

## 2020-05-02 DIAGNOSIS — Z79899 Other long term (current) drug therapy: Secondary | ICD-10-CM | POA: Diagnosis not present

## 2020-05-02 DIAGNOSIS — E1159 Type 2 diabetes mellitus with other circulatory complications: Secondary | ICD-10-CM | POA: Diagnosis not present

## 2020-05-09 DIAGNOSIS — R7309 Other abnormal glucose: Secondary | ICD-10-CM | POA: Diagnosis not present

## 2020-05-09 DIAGNOSIS — E1122 Type 2 diabetes mellitus with diabetic chronic kidney disease: Secondary | ICD-10-CM | POA: Diagnosis not present

## 2020-05-09 DIAGNOSIS — M1611 Unilateral primary osteoarthritis, right hip: Secondary | ICD-10-CM | POA: Diagnosis not present

## 2020-05-18 DIAGNOSIS — M79672 Pain in left foot: Secondary | ICD-10-CM | POA: Diagnosis not present

## 2020-05-18 DIAGNOSIS — M79675 Pain in left toe(s): Secondary | ICD-10-CM | POA: Diagnosis not present

## 2020-05-18 DIAGNOSIS — M79671 Pain in right foot: Secondary | ICD-10-CM | POA: Diagnosis not present

## 2020-05-18 DIAGNOSIS — E1140 Type 2 diabetes mellitus with diabetic neuropathy, unspecified: Secondary | ICD-10-CM | POA: Diagnosis not present

## 2020-05-18 DIAGNOSIS — L110 Acquired keratosis follicularis: Secondary | ICD-10-CM | POA: Diagnosis not present

## 2020-05-18 DIAGNOSIS — I739 Peripheral vascular disease, unspecified: Secondary | ICD-10-CM | POA: Diagnosis not present

## 2020-05-18 DIAGNOSIS — M79674 Pain in right toe(s): Secondary | ICD-10-CM | POA: Diagnosis not present

## 2020-06-13 DIAGNOSIS — R8279 Other abnormal findings on microbiological examination of urine: Secondary | ICD-10-CM | POA: Diagnosis not present

## 2020-06-13 DIAGNOSIS — N3281 Overactive bladder: Secondary | ICD-10-CM | POA: Diagnosis not present

## 2020-06-13 DIAGNOSIS — N200 Calculus of kidney: Secondary | ICD-10-CM | POA: Diagnosis not present

## 2020-07-15 DIAGNOSIS — I10 Essential (primary) hypertension: Secondary | ICD-10-CM | POA: Diagnosis not present

## 2020-07-15 DIAGNOSIS — E119 Type 2 diabetes mellitus without complications: Secondary | ICD-10-CM | POA: Diagnosis not present

## 2020-08-03 DIAGNOSIS — M79675 Pain in left toe(s): Secondary | ICD-10-CM | POA: Diagnosis not present

## 2020-08-03 DIAGNOSIS — E1140 Type 2 diabetes mellitus with diabetic neuropathy, unspecified: Secondary | ICD-10-CM | POA: Diagnosis not present

## 2020-08-03 DIAGNOSIS — M79671 Pain in right foot: Secondary | ICD-10-CM | POA: Diagnosis not present

## 2020-08-03 DIAGNOSIS — M79674 Pain in right toe(s): Secondary | ICD-10-CM | POA: Diagnosis not present

## 2020-08-03 DIAGNOSIS — M79672 Pain in left foot: Secondary | ICD-10-CM | POA: Diagnosis not present

## 2020-08-03 DIAGNOSIS — L110 Acquired keratosis follicularis: Secondary | ICD-10-CM | POA: Diagnosis not present

## 2020-08-03 DIAGNOSIS — I739 Peripheral vascular disease, unspecified: Secondary | ICD-10-CM | POA: Diagnosis not present

## 2020-09-04 DIAGNOSIS — E785 Hyperlipidemia, unspecified: Secondary | ICD-10-CM | POA: Diagnosis not present

## 2020-09-04 DIAGNOSIS — E1159 Type 2 diabetes mellitus with other circulatory complications: Secondary | ICD-10-CM | POA: Diagnosis not present

## 2020-09-07 DIAGNOSIS — H524 Presbyopia: Secondary | ICD-10-CM | POA: Diagnosis not present

## 2020-09-07 DIAGNOSIS — H5213 Myopia, bilateral: Secondary | ICD-10-CM | POA: Diagnosis not present

## 2020-09-07 DIAGNOSIS — H2513 Age-related nuclear cataract, bilateral: Secondary | ICD-10-CM | POA: Diagnosis not present

## 2020-09-07 DIAGNOSIS — H52203 Unspecified astigmatism, bilateral: Secondary | ICD-10-CM | POA: Diagnosis not present

## 2020-09-07 DIAGNOSIS — E1136 Type 2 diabetes mellitus with diabetic cataract: Secondary | ICD-10-CM | POA: Diagnosis not present

## 2020-09-07 DIAGNOSIS — H25013 Cortical age-related cataract, bilateral: Secondary | ICD-10-CM | POA: Diagnosis not present

## 2020-09-11 DIAGNOSIS — I10 Essential (primary) hypertension: Secondary | ICD-10-CM | POA: Diagnosis not present

## 2020-09-11 DIAGNOSIS — E119 Type 2 diabetes mellitus without complications: Secondary | ICD-10-CM | POA: Diagnosis not present

## 2020-09-11 DIAGNOSIS — R7309 Other abnormal glucose: Secondary | ICD-10-CM | POA: Diagnosis not present

## 2020-09-11 DIAGNOSIS — E785 Hyperlipidemia, unspecified: Secondary | ICD-10-CM | POA: Diagnosis not present

## 2020-09-14 DIAGNOSIS — I10 Essential (primary) hypertension: Secondary | ICD-10-CM | POA: Diagnosis not present

## 2020-09-14 DIAGNOSIS — E119 Type 2 diabetes mellitus without complications: Secondary | ICD-10-CM | POA: Diagnosis not present

## 2020-10-11 DIAGNOSIS — H2511 Age-related nuclear cataract, right eye: Secondary | ICD-10-CM | POA: Diagnosis not present

## 2020-10-11 DIAGNOSIS — H25011 Cortical age-related cataract, right eye: Secondary | ICD-10-CM | POA: Diagnosis not present

## 2020-10-11 DIAGNOSIS — H25012 Cortical age-related cataract, left eye: Secondary | ICD-10-CM | POA: Diagnosis not present

## 2020-10-11 DIAGNOSIS — H2512 Age-related nuclear cataract, left eye: Secondary | ICD-10-CM | POA: Diagnosis not present

## 2020-10-19 DIAGNOSIS — M79671 Pain in right foot: Secondary | ICD-10-CM | POA: Diagnosis not present

## 2020-10-19 DIAGNOSIS — M79672 Pain in left foot: Secondary | ICD-10-CM | POA: Diagnosis not present

## 2020-10-19 DIAGNOSIS — M79675 Pain in left toe(s): Secondary | ICD-10-CM | POA: Diagnosis not present

## 2020-10-19 DIAGNOSIS — M79674 Pain in right toe(s): Secondary | ICD-10-CM | POA: Diagnosis not present

## 2020-10-19 DIAGNOSIS — E1140 Type 2 diabetes mellitus with diabetic neuropathy, unspecified: Secondary | ICD-10-CM | POA: Diagnosis not present

## 2020-10-19 DIAGNOSIS — I739 Peripheral vascular disease, unspecified: Secondary | ICD-10-CM | POA: Diagnosis not present

## 2020-10-19 DIAGNOSIS — L110 Acquired keratosis follicularis: Secondary | ICD-10-CM | POA: Diagnosis not present

## 2020-10-25 DIAGNOSIS — H25011 Cortical age-related cataract, right eye: Secondary | ICD-10-CM | POA: Diagnosis not present

## 2020-10-25 DIAGNOSIS — H2511 Age-related nuclear cataract, right eye: Secondary | ICD-10-CM | POA: Diagnosis not present

## 2020-12-28 DIAGNOSIS — E1140 Type 2 diabetes mellitus with diabetic neuropathy, unspecified: Secondary | ICD-10-CM | POA: Diagnosis not present

## 2020-12-28 DIAGNOSIS — M79672 Pain in left foot: Secondary | ICD-10-CM | POA: Diagnosis not present

## 2020-12-28 DIAGNOSIS — M79671 Pain in right foot: Secondary | ICD-10-CM | POA: Diagnosis not present

## 2020-12-28 DIAGNOSIS — I739 Peripheral vascular disease, unspecified: Secondary | ICD-10-CM | POA: Diagnosis not present

## 2020-12-28 DIAGNOSIS — M79675 Pain in left toe(s): Secondary | ICD-10-CM | POA: Diagnosis not present

## 2020-12-28 DIAGNOSIS — L110 Acquired keratosis follicularis: Secondary | ICD-10-CM | POA: Diagnosis not present

## 2020-12-28 DIAGNOSIS — M79674 Pain in right toe(s): Secondary | ICD-10-CM | POA: Diagnosis not present

## 2021-01-08 DIAGNOSIS — E1159 Type 2 diabetes mellitus with other circulatory complications: Secondary | ICD-10-CM | POA: Diagnosis not present

## 2021-01-12 DIAGNOSIS — R7309 Other abnormal glucose: Secondary | ICD-10-CM | POA: Diagnosis not present

## 2021-01-12 DIAGNOSIS — I10 Essential (primary) hypertension: Secondary | ICD-10-CM | POA: Diagnosis not present

## 2021-01-12 DIAGNOSIS — R000 Tachycardia, unspecified: Secondary | ICD-10-CM | POA: Diagnosis not present

## 2021-01-12 DIAGNOSIS — E1100 Type 2 diabetes mellitus with hyperosmolarity without nonketotic hyperglycemic-hyperosmolar coma (NKHHC): Secondary | ICD-10-CM | POA: Diagnosis not present

## 2021-01-12 DIAGNOSIS — E119 Type 2 diabetes mellitus without complications: Secondary | ICD-10-CM | POA: Diagnosis not present

## 2021-01-12 DIAGNOSIS — R051 Acute cough: Secondary | ICD-10-CM | POA: Diagnosis not present

## 2021-01-23 DIAGNOSIS — G5710 Meralgia paresthetica, unspecified lower limb: Secondary | ICD-10-CM | POA: Diagnosis not present

## 2021-01-25 DIAGNOSIS — M79671 Pain in right foot: Secondary | ICD-10-CM | POA: Diagnosis not present

## 2021-01-25 DIAGNOSIS — M1990 Unspecified osteoarthritis, unspecified site: Secondary | ICD-10-CM | POA: Diagnosis not present

## 2021-01-25 DIAGNOSIS — M722 Plantar fascial fibromatosis: Secondary | ICD-10-CM | POA: Diagnosis not present

## 2021-01-25 DIAGNOSIS — M7731 Calcaneal spur, right foot: Secondary | ICD-10-CM | POA: Diagnosis not present

## 2021-02-15 DIAGNOSIS — M722 Plantar fascial fibromatosis: Secondary | ICD-10-CM | POA: Diagnosis not present

## 2021-02-15 DIAGNOSIS — E1140 Type 2 diabetes mellitus with diabetic neuropathy, unspecified: Secondary | ICD-10-CM | POA: Diagnosis not present

## 2021-02-15 DIAGNOSIS — M79671 Pain in right foot: Secondary | ICD-10-CM | POA: Diagnosis not present

## 2021-02-23 ENCOUNTER — Other Ambulatory Visit (HOSPITAL_COMMUNITY): Payer: Self-pay | Admitting: Internal Medicine

## 2021-02-23 DIAGNOSIS — Z1231 Encounter for screening mammogram for malignant neoplasm of breast: Secondary | ICD-10-CM

## 2021-03-06 DIAGNOSIS — R051 Acute cough: Secondary | ICD-10-CM | POA: Diagnosis not present

## 2021-03-06 DIAGNOSIS — G5710 Meralgia paresthetica, unspecified lower limb: Secondary | ICD-10-CM | POA: Diagnosis not present

## 2021-03-06 DIAGNOSIS — Z961 Presence of intraocular lens: Secondary | ICD-10-CM | POA: Diagnosis not present

## 2021-03-08 DIAGNOSIS — M79671 Pain in right foot: Secondary | ICD-10-CM | POA: Diagnosis not present

## 2021-03-08 DIAGNOSIS — M79674 Pain in right toe(s): Secondary | ICD-10-CM | POA: Diagnosis not present

## 2021-03-08 DIAGNOSIS — M79672 Pain in left foot: Secondary | ICD-10-CM | POA: Diagnosis not present

## 2021-03-08 DIAGNOSIS — E1140 Type 2 diabetes mellitus with diabetic neuropathy, unspecified: Secondary | ICD-10-CM | POA: Diagnosis not present

## 2021-03-08 DIAGNOSIS — M79675 Pain in left toe(s): Secondary | ICD-10-CM | POA: Diagnosis not present

## 2021-03-08 DIAGNOSIS — L110 Acquired keratosis follicularis: Secondary | ICD-10-CM | POA: Diagnosis not present

## 2021-03-08 DIAGNOSIS — I739 Peripheral vascular disease, unspecified: Secondary | ICD-10-CM | POA: Diagnosis not present

## 2021-03-14 DIAGNOSIS — Z23 Encounter for immunization: Secondary | ICD-10-CM | POA: Diagnosis not present

## 2021-03-16 DIAGNOSIS — I10 Essential (primary) hypertension: Secondary | ICD-10-CM | POA: Diagnosis not present

## 2021-03-16 DIAGNOSIS — E119 Type 2 diabetes mellitus without complications: Secondary | ICD-10-CM | POA: Diagnosis not present

## 2021-03-28 ENCOUNTER — Ambulatory Visit (HOSPITAL_COMMUNITY)
Admission: RE | Admit: 2021-03-28 | Discharge: 2021-03-28 | Disposition: A | Discharge status: Home / Self Care | Payer: Medicare Other | Source: Ambulatory Visit | Attending: Internal Medicine | Admitting: Internal Medicine

## 2021-03-28 ENCOUNTER — Other Ambulatory Visit: Payer: Self-pay

## 2021-03-28 DIAGNOSIS — Z1231 Encounter for screening mammogram for malignant neoplasm of breast: Secondary | ICD-10-CM | POA: Diagnosis not present

## 2021-05-08 DIAGNOSIS — I10 Essential (primary) hypertension: Secondary | ICD-10-CM | POA: Diagnosis not present

## 2021-05-08 DIAGNOSIS — E785 Hyperlipidemia, unspecified: Secondary | ICD-10-CM | POA: Diagnosis not present

## 2021-05-08 DIAGNOSIS — E1159 Type 2 diabetes mellitus with other circulatory complications: Secondary | ICD-10-CM | POA: Diagnosis not present

## 2021-05-08 DIAGNOSIS — G43009 Migraine without aura, not intractable, without status migrainosus: Secondary | ICD-10-CM | POA: Diagnosis not present

## 2021-05-08 DIAGNOSIS — G5710 Meralgia paresthetica, unspecified lower limb: Secondary | ICD-10-CM | POA: Diagnosis not present

## 2021-05-15 DIAGNOSIS — Z23 Encounter for immunization: Secondary | ICD-10-CM | POA: Diagnosis not present

## 2021-05-15 DIAGNOSIS — E785 Hyperlipidemia, unspecified: Secondary | ICD-10-CM | POA: Diagnosis not present

## 2021-05-15 DIAGNOSIS — E1122 Type 2 diabetes mellitus with diabetic chronic kidney disease: Secondary | ICD-10-CM | POA: Diagnosis not present

## 2021-05-15 DIAGNOSIS — R7309 Other abnormal glucose: Secondary | ICD-10-CM | POA: Diagnosis not present

## 2021-05-15 DIAGNOSIS — I10 Essential (primary) hypertension: Secondary | ICD-10-CM | POA: Diagnosis not present

## 2021-05-17 DIAGNOSIS — M79672 Pain in left foot: Secondary | ICD-10-CM | POA: Diagnosis not present

## 2021-05-17 DIAGNOSIS — E1140 Type 2 diabetes mellitus with diabetic neuropathy, unspecified: Secondary | ICD-10-CM | POA: Diagnosis not present

## 2021-05-17 DIAGNOSIS — L110 Acquired keratosis follicularis: Secondary | ICD-10-CM | POA: Diagnosis not present

## 2021-05-17 DIAGNOSIS — M79671 Pain in right foot: Secondary | ICD-10-CM | POA: Diagnosis not present

## 2021-05-17 DIAGNOSIS — M79675 Pain in left toe(s): Secondary | ICD-10-CM | POA: Diagnosis not present

## 2021-05-17 DIAGNOSIS — M79674 Pain in right toe(s): Secondary | ICD-10-CM | POA: Diagnosis not present

## 2021-05-17 DIAGNOSIS — I739 Peripheral vascular disease, unspecified: Secondary | ICD-10-CM | POA: Diagnosis not present

## 2021-05-28 DIAGNOSIS — L304 Erythema intertrigo: Secondary | ICD-10-CM | POA: Diagnosis not present

## 2021-05-28 DIAGNOSIS — L820 Inflamed seborrheic keratosis: Secondary | ICD-10-CM | POA: Diagnosis not present

## 2021-06-11 DIAGNOSIS — N3281 Overactive bladder: Secondary | ICD-10-CM | POA: Diagnosis not present

## 2021-06-11 DIAGNOSIS — N200 Calculus of kidney: Secondary | ICD-10-CM | POA: Diagnosis not present

## 2021-06-19 IMAGING — MR MR KNEE*L* W/O CM
6 series · 37 of 40 positions shown · non-contrast
Comparison: None.

CLINICAL DATA: Joint pain, weakness and numbness. Palpable lump in
the back of the knee.

EXAM:
MRI OF THE LEFT KNEE WITHOUT CONTRAST
TECHNIQUE: Multiplanar, multisequence MR imaging of the knee was performed. No
intravenous contrast was administered.

[Series 5: T2 fat-sat · axial · left · 4.0mm · 0.50mm/px · z∈[-27,+86]mm · 6 of 27 slices shown (1 of 3)]
[im 1/27]
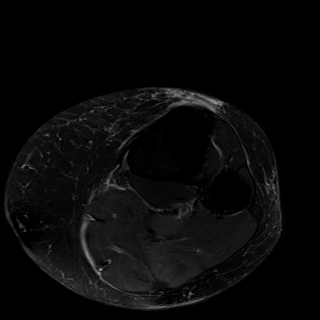
[im 6/27]
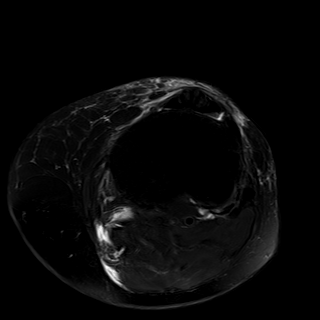
[im 11/27]
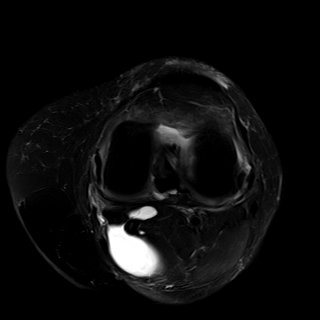
[im 16/27]
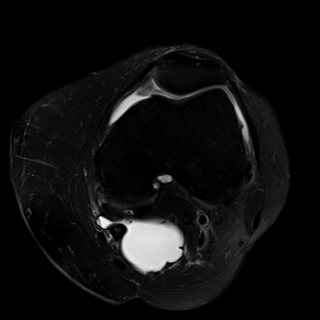
[im 21/27]
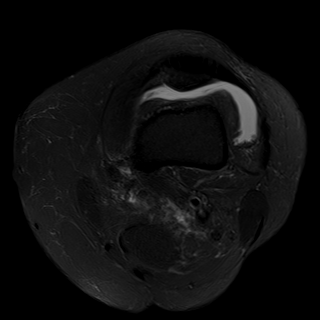
[im 27/27]
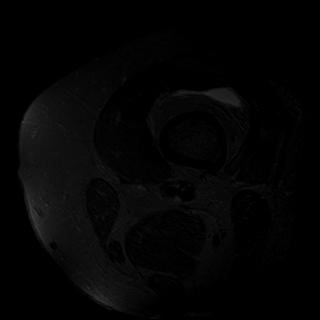

[Series 6: T2 fat-sat · coronal · left · 4.0mm · 0.39mm/px · 7 of 28 slices shown (2 of 3)]
[im 1/28]
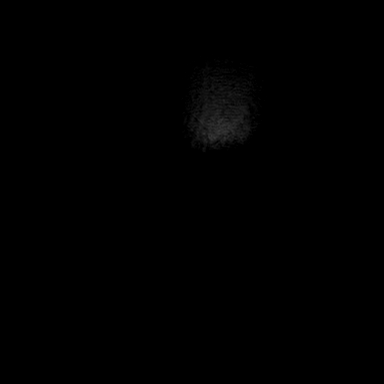
[im 5/28]
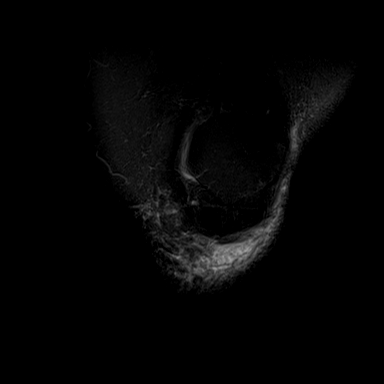
[im 10/28]
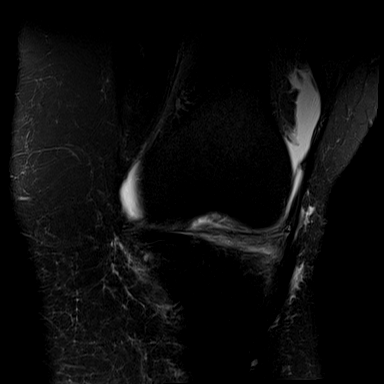
[im 14/28]
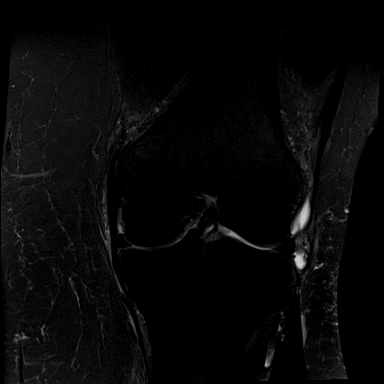
[im 19/28]
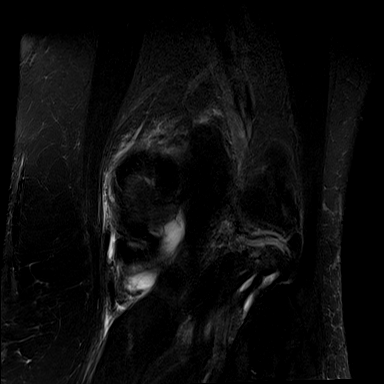
[im 23/28]
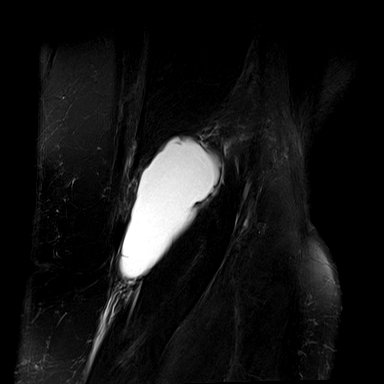
[im 28/28]
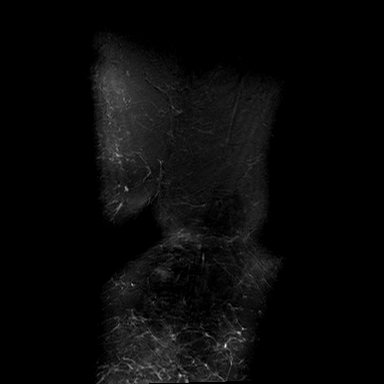

[Series 7: T1 · coronal · left · 4.0mm · 0.39mm/px · 4 of 28 slices shown]
[im 1/28]
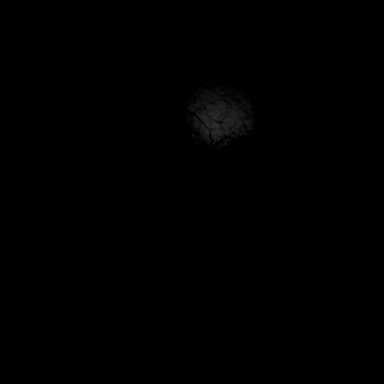
[im 5/28]
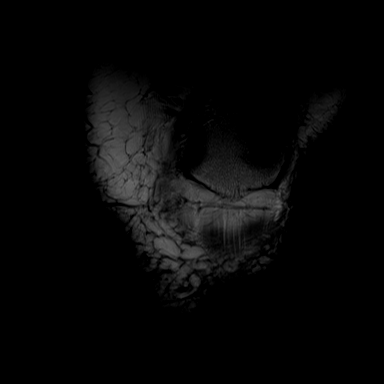
[im 10/28]
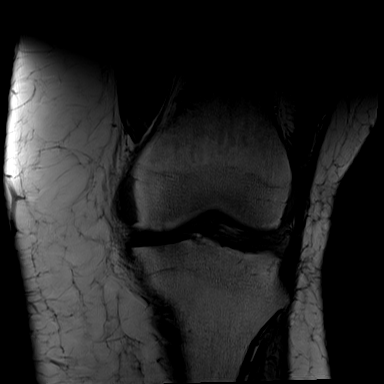
[im 14/28]
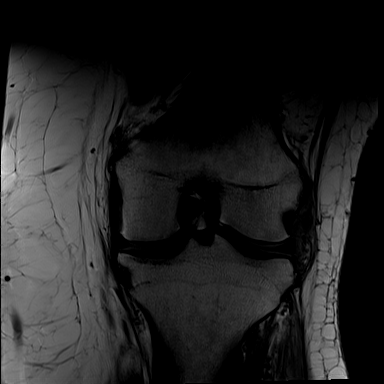

[Series 8: PD fat-sat · coronal · left · 3.0mm · 0.47mm/px · 8 of 32 slices shown (1 of 2)]
[im 1/32]
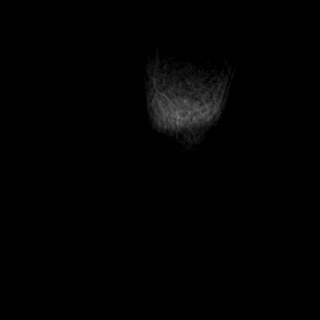
[im 5/32]
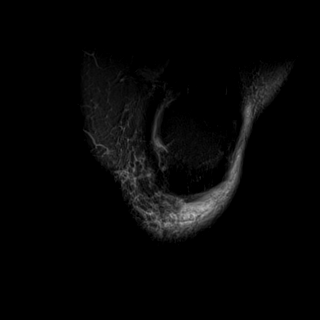
[im 9/32]
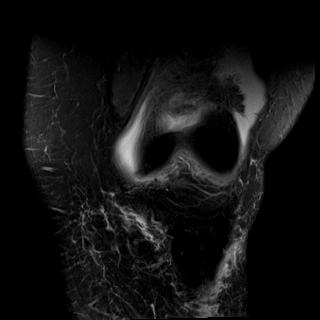
[im 14/32]
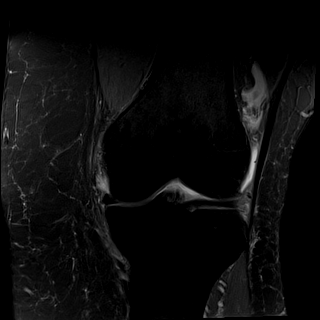
[im 18/32]
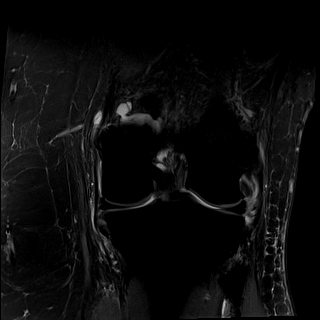
[im 23/32]
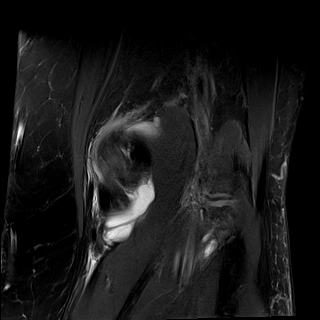
[im 27/32]
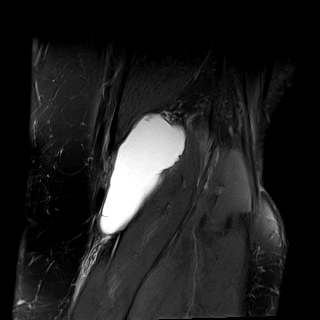
[im 32/32]
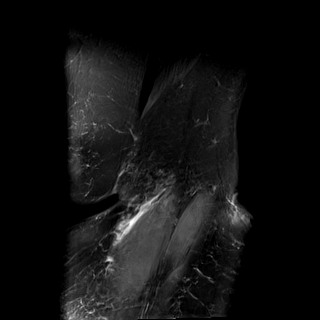

[Series 9: PD fat-sat · sagittal · left · 3.0mm · 0.39mm/px · 6 of 27 slices shown (2 of 2)]
[im 1/27]
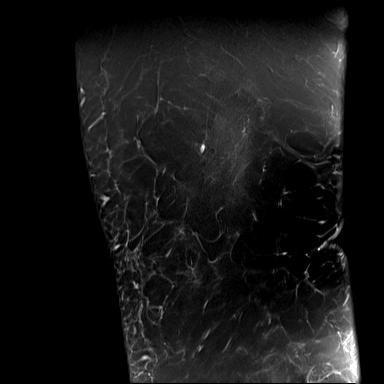
[im 6/27]
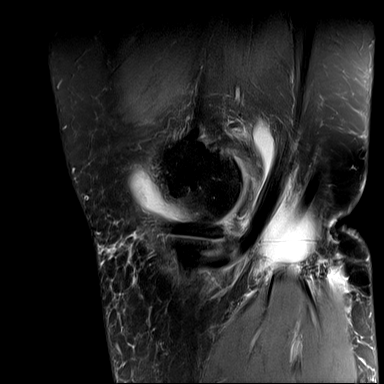
[im 11/27]
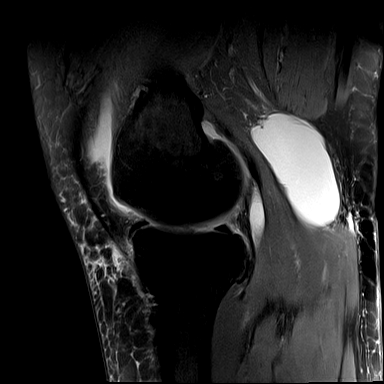
[im 16/27]
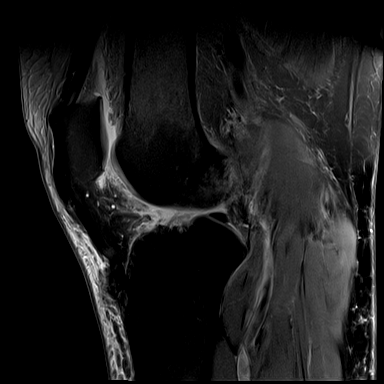
[im 21/27]
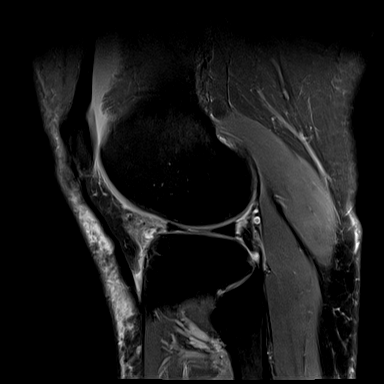
[im 27/27]
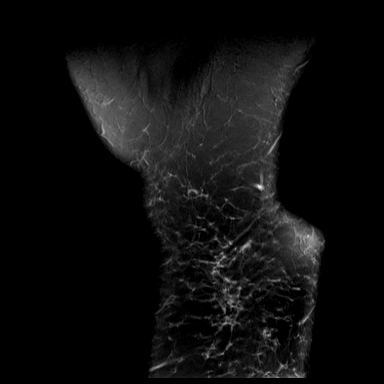

[Series 10: T2 fat-sat · sagittal · left · 3.0mm · 0.39mm/px · 6 of 27 slices shown (3 of 3)]
[im 1/27]
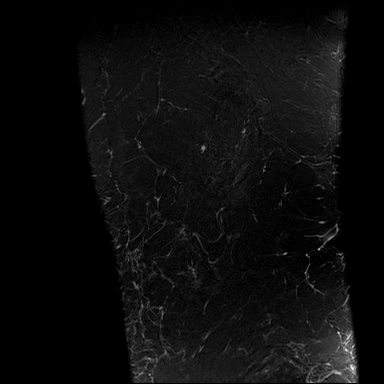
[im 6/27]
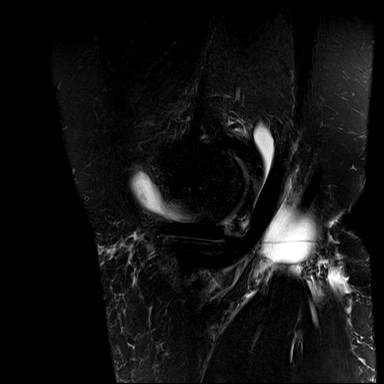
[im 11/27]
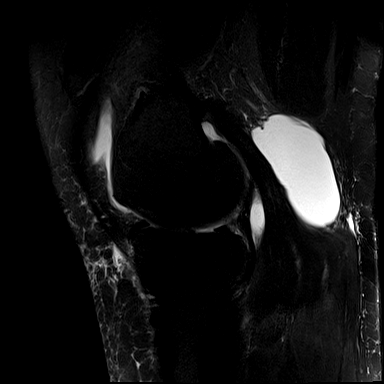
[im 16/27]
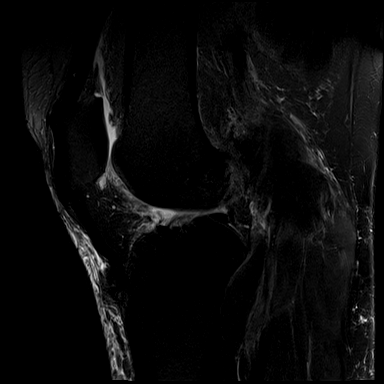
[im 21/27]
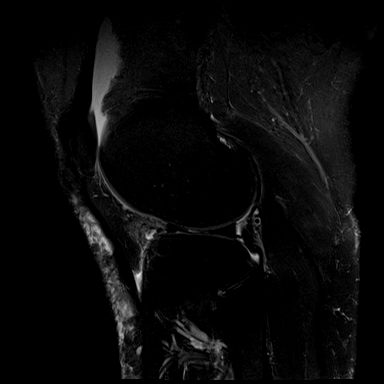
[im 27/27]
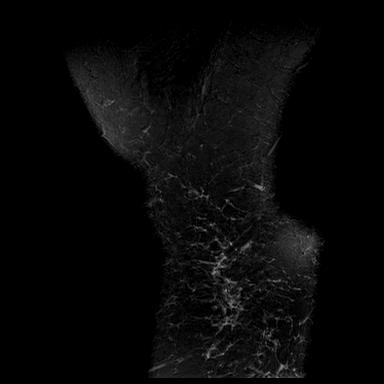

[37 of 40 positions shown; findings below may reference images not displayed]

FINDINGS: MENISCI

Medial meniscus:  Intact

Lateral meniscus: Degenerative fraying type changes involving the
anterior horn without discrete tear.

LIGAMENTS

Cruciates:  Intact

Collaterals:  Intact

CARTILAGE

Patellofemoral:  Normal

Medial:  Normal

Lateral:  Normal

Joint:  Moderate-sized joint effusion and mild synovitis.

Popliteal Fossa: Large leaking Baker's cyst likely account for the
patient's palpable abnormality. This measures a maximum of 5 cm.

Extensor Mechanism: The patella retinacular structures are intact
and the quadriceps and patellar tendons are intact. There is however
moderate proximal and severe distal patellar tendinopathy. The
distal patellar tendon is markedly thickened and demonstrates
diffuse increased signal intensity consistent with chronic
tendinopathy. No discrete tear or rupture.

Bones:  No acute bony findings.

Other: Normal knee musculature.
IMPRESSION: 1. Large leaking Baker's cyst likely accounting for the patient's
palpable abnormality.
2. Moderate proximal and severe distal patellar tendinopathy.
3. Intact ligamentous structures and no acute bony findings.
4. Degenerative fraying type changes involving the anterior horn of
the lateral meniscus but no discrete tear.
5. Moderate-sized joint effusion and mild synovitis.

## 2021-06-26 DIAGNOSIS — M79674 Pain in right toe(s): Secondary | ICD-10-CM | POA: Diagnosis not present

## 2021-06-26 DIAGNOSIS — M7731 Calcaneal spur, right foot: Secondary | ICD-10-CM | POA: Diagnosis not present

## 2021-06-26 DIAGNOSIS — M79671 Pain in right foot: Secondary | ICD-10-CM | POA: Diagnosis not present

## 2021-06-26 DIAGNOSIS — L02611 Cutaneous abscess of right foot: Secondary | ICD-10-CM | POA: Diagnosis not present

## 2021-06-26 DIAGNOSIS — R799 Abnormal finding of blood chemistry, unspecified: Secondary | ICD-10-CM | POA: Diagnosis not present

## 2021-06-28 DIAGNOSIS — M7731 Calcaneal spur, right foot: Secondary | ICD-10-CM | POA: Diagnosis not present

## 2021-06-28 DIAGNOSIS — M79671 Pain in right foot: Secondary | ICD-10-CM | POA: Diagnosis not present

## 2021-06-28 DIAGNOSIS — M79674 Pain in right toe(s): Secondary | ICD-10-CM | POA: Diagnosis not present

## 2021-06-28 DIAGNOSIS — L02611 Cutaneous abscess of right foot: Secondary | ICD-10-CM | POA: Diagnosis not present

## 2021-07-10 DIAGNOSIS — M722 Plantar fascial fibromatosis: Secondary | ICD-10-CM | POA: Diagnosis not present

## 2021-07-10 DIAGNOSIS — M79671 Pain in right foot: Secondary | ICD-10-CM | POA: Diagnosis not present

## 2021-07-25 DIAGNOSIS — M79675 Pain in left toe(s): Secondary | ICD-10-CM | POA: Diagnosis not present

## 2021-07-25 DIAGNOSIS — M79672 Pain in left foot: Secondary | ICD-10-CM | POA: Diagnosis not present

## 2021-07-25 DIAGNOSIS — M79671 Pain in right foot: Secondary | ICD-10-CM | POA: Diagnosis not present

## 2021-07-25 DIAGNOSIS — L110 Acquired keratosis follicularis: Secondary | ICD-10-CM | POA: Diagnosis not present

## 2021-07-25 DIAGNOSIS — E1140 Type 2 diabetes mellitus with diabetic neuropathy, unspecified: Secondary | ICD-10-CM | POA: Diagnosis not present

## 2021-07-25 DIAGNOSIS — M79674 Pain in right toe(s): Secondary | ICD-10-CM | POA: Diagnosis not present

## 2021-09-04 DIAGNOSIS — H43391 Other vitreous opacities, right eye: Secondary | ICD-10-CM | POA: Diagnosis not present

## 2021-09-04 DIAGNOSIS — H02824 Cysts of left upper eyelid: Secondary | ICD-10-CM | POA: Diagnosis not present

## 2021-09-05 DIAGNOSIS — E1129 Type 2 diabetes mellitus with other diabetic kidney complication: Secondary | ICD-10-CM | POA: Diagnosis not present

## 2021-09-05 DIAGNOSIS — I10 Essential (primary) hypertension: Secondary | ICD-10-CM | POA: Diagnosis not present

## 2021-09-05 DIAGNOSIS — N1831 Chronic kidney disease, stage 3a: Secondary | ICD-10-CM | POA: Diagnosis not present

## 2021-09-12 DIAGNOSIS — R739 Hyperglycemia, unspecified: Secondary | ICD-10-CM | POA: Diagnosis not present

## 2021-09-12 DIAGNOSIS — I10 Essential (primary) hypertension: Secondary | ICD-10-CM | POA: Diagnosis not present

## 2021-09-12 DIAGNOSIS — E1122 Type 2 diabetes mellitus with diabetic chronic kidney disease: Secondary | ICD-10-CM | POA: Diagnosis not present

## 2021-09-13 DIAGNOSIS — H02824 Cysts of left upper eyelid: Secondary | ICD-10-CM | POA: Diagnosis not present

## 2021-10-03 DIAGNOSIS — L110 Acquired keratosis follicularis: Secondary | ICD-10-CM | POA: Diagnosis not present

## 2021-10-03 DIAGNOSIS — M79675 Pain in left toe(s): Secondary | ICD-10-CM | POA: Diagnosis not present

## 2021-10-03 DIAGNOSIS — M79671 Pain in right foot: Secondary | ICD-10-CM | POA: Diagnosis not present

## 2021-10-03 DIAGNOSIS — M79674 Pain in right toe(s): Secondary | ICD-10-CM | POA: Diagnosis not present

## 2021-10-03 DIAGNOSIS — E1140 Type 2 diabetes mellitus with diabetic neuropathy, unspecified: Secondary | ICD-10-CM | POA: Diagnosis not present

## 2021-10-03 DIAGNOSIS — M79672 Pain in left foot: Secondary | ICD-10-CM | POA: Diagnosis not present

## 2021-10-11 DIAGNOSIS — M10072 Idiopathic gout, left ankle and foot: Secondary | ICD-10-CM | POA: Diagnosis not present

## 2021-10-29 ENCOUNTER — Other Ambulatory Visit (HOSPITAL_COMMUNITY): Payer: Self-pay | Admitting: Internal Medicine

## 2021-10-29 ENCOUNTER — Ambulatory Visit (HOSPITAL_COMMUNITY)
Admission: RE | Admit: 2021-10-29 | Discharge: 2021-10-29 | Disposition: A | Discharge status: Home / Self Care | Payer: Medicare Other | Source: Ambulatory Visit | Attending: Internal Medicine | Admitting: Internal Medicine

## 2021-10-29 DIAGNOSIS — M542 Cervicalgia: Secondary | ICD-10-CM | POA: Diagnosis not present

## 2021-11-15 DIAGNOSIS — E119 Type 2 diabetes mellitus without complications: Secondary | ICD-10-CM | POA: Diagnosis not present

## 2021-11-15 DIAGNOSIS — I10 Essential (primary) hypertension: Secondary | ICD-10-CM | POA: Diagnosis not present

## 2021-12-19 DIAGNOSIS — M79672 Pain in left foot: Secondary | ICD-10-CM | POA: Diagnosis not present

## 2021-12-19 DIAGNOSIS — M79671 Pain in right foot: Secondary | ICD-10-CM | POA: Diagnosis not present

## 2021-12-19 DIAGNOSIS — L110 Acquired keratosis follicularis: Secondary | ICD-10-CM | POA: Diagnosis not present

## 2021-12-19 DIAGNOSIS — M79675 Pain in left toe(s): Secondary | ICD-10-CM | POA: Diagnosis not present

## 2021-12-19 DIAGNOSIS — M79674 Pain in right toe(s): Secondary | ICD-10-CM | POA: Diagnosis not present

## 2021-12-19 DIAGNOSIS — E1140 Type 2 diabetes mellitus with diabetic neuropathy, unspecified: Secondary | ICD-10-CM | POA: Diagnosis not present

## 2022-01-02 DIAGNOSIS — E1159 Type 2 diabetes mellitus with other circulatory complications: Secondary | ICD-10-CM | POA: Diagnosis not present

## 2022-01-02 DIAGNOSIS — I10 Essential (primary) hypertension: Secondary | ICD-10-CM | POA: Diagnosis not present

## 2022-01-02 DIAGNOSIS — Z79899 Other long term (current) drug therapy: Secondary | ICD-10-CM | POA: Diagnosis not present

## 2022-01-09 ENCOUNTER — Other Ambulatory Visit (HOSPITAL_COMMUNITY): Payer: Self-pay | Admitting: Internal Medicine

## 2022-01-09 DIAGNOSIS — G629 Polyneuropathy, unspecified: Secondary | ICD-10-CM | POA: Diagnosis not present

## 2022-01-09 DIAGNOSIS — E1169 Type 2 diabetes mellitus with other specified complication: Secondary | ICD-10-CM | POA: Diagnosis not present

## 2022-01-09 DIAGNOSIS — Z780 Asymptomatic menopausal state: Secondary | ICD-10-CM

## 2022-01-09 DIAGNOSIS — I10 Essential (primary) hypertension: Secondary | ICD-10-CM | POA: Diagnosis not present

## 2022-01-09 DIAGNOSIS — Z23 Encounter for immunization: Secondary | ICD-10-CM | POA: Diagnosis not present

## 2022-01-17 ENCOUNTER — Ambulatory Visit (HOSPITAL_COMMUNITY)
Admission: RE | Admit: 2022-01-17 | Discharge: 2022-01-17 | Disposition: A | Discharge status: Home / Self Care | Payer: Medicare Other | Source: Ambulatory Visit | Attending: Internal Medicine | Admitting: Internal Medicine

## 2022-01-17 DIAGNOSIS — Z780 Asymptomatic menopausal state: Secondary | ICD-10-CM | POA: Insufficient documentation

## 2022-01-21 ENCOUNTER — Ambulatory Visit (HOSPITAL_COMMUNITY)
Admission: RE | Admit: 2022-01-21 | Discharge: 2022-01-21 | Disposition: A | Discharge status: Home / Self Care | Payer: Medicare Other | Source: Ambulatory Visit | Attending: Internal Medicine | Admitting: Internal Medicine

## 2022-01-21 ENCOUNTER — Other Ambulatory Visit (HOSPITAL_COMMUNITY): Payer: Self-pay | Admitting: Internal Medicine

## 2022-01-21 DIAGNOSIS — R059 Cough, unspecified: Secondary | ICD-10-CM | POA: Diagnosis not present

## 2022-01-21 DIAGNOSIS — R0789 Other chest pain: Secondary | ICD-10-CM | POA: Diagnosis not present

## 2022-03-01 ENCOUNTER — Other Ambulatory Visit (HOSPITAL_COMMUNITY): Payer: Self-pay | Admitting: Internal Medicine

## 2022-03-01 DIAGNOSIS — Z1231 Encounter for screening mammogram for malignant neoplasm of breast: Secondary | ICD-10-CM

## 2022-03-06 DIAGNOSIS — M79675 Pain in left toe(s): Secondary | ICD-10-CM | POA: Diagnosis not present

## 2022-03-06 DIAGNOSIS — E1140 Type 2 diabetes mellitus with diabetic neuropathy, unspecified: Secondary | ICD-10-CM | POA: Diagnosis not present

## 2022-03-06 DIAGNOSIS — M79671 Pain in right foot: Secondary | ICD-10-CM | POA: Diagnosis not present

## 2022-03-06 DIAGNOSIS — L110 Acquired keratosis follicularis: Secondary | ICD-10-CM | POA: Diagnosis not present

## 2022-03-06 DIAGNOSIS — M79672 Pain in left foot: Secondary | ICD-10-CM | POA: Diagnosis not present

## 2022-03-06 DIAGNOSIS — M79674 Pain in right toe(s): Secondary | ICD-10-CM | POA: Diagnosis not present

## 2022-03-07 DIAGNOSIS — E119 Type 2 diabetes mellitus without complications: Secondary | ICD-10-CM | POA: Diagnosis not present

## 2022-03-07 DIAGNOSIS — Z961 Presence of intraocular lens: Secondary | ICD-10-CM | POA: Diagnosis not present

## 2022-03-07 DIAGNOSIS — H524 Presbyopia: Secondary | ICD-10-CM | POA: Diagnosis not present

## 2022-03-07 DIAGNOSIS — H52203 Unspecified astigmatism, bilateral: Secondary | ICD-10-CM | POA: Diagnosis not present

## 2022-03-07 DIAGNOSIS — Z7984 Long term (current) use of oral hypoglycemic drugs: Secondary | ICD-10-CM | POA: Diagnosis not present

## 2022-04-01 ENCOUNTER — Ambulatory Visit (HOSPITAL_COMMUNITY)
Admission: RE | Admit: 2022-04-01 | Discharge: 2022-04-01 | Disposition: A | Discharge status: Home / Self Care | Payer: Medicare Other | Source: Ambulatory Visit | Attending: Internal Medicine | Admitting: Internal Medicine

## 2022-04-01 DIAGNOSIS — Z1231 Encounter for screening mammogram for malignant neoplasm of breast: Secondary | ICD-10-CM | POA: Diagnosis not present

## 2022-04-03 ENCOUNTER — Other Ambulatory Visit (HOSPITAL_COMMUNITY): Payer: Self-pay | Admitting: Internal Medicine

## 2022-04-03 DIAGNOSIS — R928 Other abnormal and inconclusive findings on diagnostic imaging of breast: Secondary | ICD-10-CM

## 2022-04-18 ENCOUNTER — Encounter (HOSPITAL_COMMUNITY): Payer: Self-pay

## 2022-04-18 ENCOUNTER — Ambulatory Visit (HOSPITAL_COMMUNITY)
Admission: RE | Admit: 2022-04-18 | Discharge: 2022-04-18 | Disposition: A | Discharge status: Home / Self Care | Payer: Medicare Other | Source: Ambulatory Visit | Attending: Internal Medicine | Admitting: Internal Medicine

## 2022-04-18 DIAGNOSIS — R928 Other abnormal and inconclusive findings on diagnostic imaging of breast: Secondary | ICD-10-CM | POA: Diagnosis not present

## 2022-05-06 DIAGNOSIS — Z79899 Other long term (current) drug therapy: Secondary | ICD-10-CM | POA: Diagnosis not present

## 2022-05-06 DIAGNOSIS — I10 Essential (primary) hypertension: Secondary | ICD-10-CM | POA: Diagnosis not present

## 2022-05-06 DIAGNOSIS — E1159 Type 2 diabetes mellitus with other circulatory complications: Secondary | ICD-10-CM | POA: Diagnosis not present

## 2022-05-06 DIAGNOSIS — N200 Calculus of kidney: Secondary | ICD-10-CM | POA: Diagnosis not present

## 2022-05-13 DIAGNOSIS — R7309 Other abnormal glucose: Secondary | ICD-10-CM | POA: Diagnosis not present

## 2022-05-13 DIAGNOSIS — R197 Diarrhea, unspecified: Secondary | ICD-10-CM | POA: Diagnosis not present

## 2022-05-13 DIAGNOSIS — E039 Hypothyroidism, unspecified: Secondary | ICD-10-CM | POA: Diagnosis not present

## 2022-05-13 DIAGNOSIS — I10 Essential (primary) hypertension: Secondary | ICD-10-CM | POA: Diagnosis not present

## 2022-05-13 DIAGNOSIS — E1169 Type 2 diabetes mellitus with other specified complication: Secondary | ICD-10-CM | POA: Diagnosis not present

## 2022-05-13 DIAGNOSIS — E785 Hyperlipidemia, unspecified: Secondary | ICD-10-CM | POA: Diagnosis not present

## 2022-05-16 DIAGNOSIS — M79671 Pain in right foot: Secondary | ICD-10-CM | POA: Diagnosis not present

## 2022-05-16 DIAGNOSIS — L110 Acquired keratosis follicularis: Secondary | ICD-10-CM | POA: Diagnosis not present

## 2022-05-16 DIAGNOSIS — M79674 Pain in right toe(s): Secondary | ICD-10-CM | POA: Diagnosis not present

## 2022-05-16 DIAGNOSIS — E1140 Type 2 diabetes mellitus with diabetic neuropathy, unspecified: Secondary | ICD-10-CM | POA: Diagnosis not present

## 2022-05-16 DIAGNOSIS — M79675 Pain in left toe(s): Secondary | ICD-10-CM | POA: Diagnosis not present

## 2022-05-16 DIAGNOSIS — M79672 Pain in left foot: Secondary | ICD-10-CM | POA: Diagnosis not present

## 2022-06-27 DIAGNOSIS — N3281 Overactive bladder: Secondary | ICD-10-CM | POA: Diagnosis not present

## 2022-07-02 DIAGNOSIS — M25571 Pain in right ankle and joints of right foot: Secondary | ICD-10-CM | POA: Diagnosis not present

## 2022-07-08 DIAGNOSIS — M25571 Pain in right ankle and joints of right foot: Secondary | ICD-10-CM | POA: Diagnosis not present

## 2022-07-11 DIAGNOSIS — M25571 Pain in right ankle and joints of right foot: Secondary | ICD-10-CM | POA: Diagnosis not present

## 2022-07-25 DIAGNOSIS — M79674 Pain in right toe(s): Secondary | ICD-10-CM | POA: Diagnosis not present

## 2022-07-25 DIAGNOSIS — M79675 Pain in left toe(s): Secondary | ICD-10-CM | POA: Diagnosis not present

## 2022-07-25 DIAGNOSIS — L110 Acquired keratosis follicularis: Secondary | ICD-10-CM | POA: Diagnosis not present

## 2022-07-25 DIAGNOSIS — E1140 Type 2 diabetes mellitus with diabetic neuropathy, unspecified: Secondary | ICD-10-CM | POA: Diagnosis not present

## 2022-07-25 DIAGNOSIS — M79671 Pain in right foot: Secondary | ICD-10-CM | POA: Diagnosis not present

## 2022-07-25 DIAGNOSIS — M79672 Pain in left foot: Secondary | ICD-10-CM | POA: Diagnosis not present

## 2022-08-07 DIAGNOSIS — R490 Dysphonia: Secondary | ICD-10-CM | POA: Diagnosis not present

## 2022-08-07 DIAGNOSIS — K219 Gastro-esophageal reflux disease without esophagitis: Secondary | ICD-10-CM | POA: Diagnosis not present

## 2022-09-06 DIAGNOSIS — E118 Type 2 diabetes mellitus with unspecified complications: Secondary | ICD-10-CM | POA: Diagnosis not present

## 2022-09-13 DIAGNOSIS — I10 Essential (primary) hypertension: Secondary | ICD-10-CM | POA: Diagnosis not present

## 2022-09-13 DIAGNOSIS — R7309 Other abnormal glucose: Secondary | ICD-10-CM | POA: Diagnosis not present

## 2022-09-13 DIAGNOSIS — E785 Hyperlipidemia, unspecified: Secondary | ICD-10-CM | POA: Diagnosis not present

## 2022-09-13 DIAGNOSIS — E1169 Type 2 diabetes mellitus with other specified complication: Secondary | ICD-10-CM | POA: Diagnosis not present

## 2022-09-23 DIAGNOSIS — M76821 Posterior tibial tendinitis, right leg: Secondary | ICD-10-CM | POA: Diagnosis not present

## 2022-10-03 DIAGNOSIS — E1140 Type 2 diabetes mellitus with diabetic neuropathy, unspecified: Secondary | ICD-10-CM | POA: Diagnosis not present

## 2022-10-03 DIAGNOSIS — M79672 Pain in left foot: Secondary | ICD-10-CM | POA: Diagnosis not present

## 2022-10-03 DIAGNOSIS — L110 Acquired keratosis follicularis: Secondary | ICD-10-CM | POA: Diagnosis not present

## 2022-10-03 DIAGNOSIS — M79674 Pain in right toe(s): Secondary | ICD-10-CM | POA: Diagnosis not present

## 2022-10-03 DIAGNOSIS — M79671 Pain in right foot: Secondary | ICD-10-CM | POA: Diagnosis not present

## 2022-10-03 DIAGNOSIS — M79675 Pain in left toe(s): Secondary | ICD-10-CM | POA: Diagnosis not present

## 2022-10-21 DIAGNOSIS — M79641 Pain in right hand: Secondary | ICD-10-CM | POA: Diagnosis not present

## 2022-10-21 DIAGNOSIS — M76821 Posterior tibial tendinitis, right leg: Secondary | ICD-10-CM | POA: Diagnosis not present

## 2022-10-21 DIAGNOSIS — M79642 Pain in left hand: Secondary | ICD-10-CM | POA: Diagnosis not present

## 2022-11-11 ENCOUNTER — Ambulatory Visit (HOSPITAL_COMMUNITY): Payer: Medicare Other | Admitting: Speech Pathology

## 2022-11-11 ENCOUNTER — Encounter (HOSPITAL_COMMUNITY): Payer: Self-pay | Admitting: Speech Pathology

## 2022-11-11 DIAGNOSIS — R490 Dysphonia: Secondary | ICD-10-CM | POA: Diagnosis not present

## 2022-11-11 NOTE — Therapy (Signed)
OUTPATIENT SPEECH LANGUAGE PATHOLOGY VOICE EVALUATION   Patient Name: Glenda White MRN: [id number] DOB:03-11-1956, 67 y.o., female Today's Date: 11/11/2022  PCP: Glenda Perches, MD REFERRING PROVIDER: Aquilla Hacker, PA-C  END OF SESSION:  End of Session - 11/11/22 0929     Visit Number 1    Number of Visits 5    Date for SLP Re-Evaluation 11/14/22    Authorization Type [employer] Medicare    SLP Start Time 0900    SLP Stop Time  0945    SLP Time Calculation (min) 45 min    Activity Tolerance Patient tolerated treatment well             Past Medical History:  Diagnosis Date   Allergy    Arthritis    Cataract    Chronic low back pain 09/16/2014   Hyperlipidemia    Hypertension    Migraines    Obstruction of right ureteropelvic junction (UPJ) due to stone    Rotator cuff arthropathy of right shoulder    Type 2 diabetes mellitus (HCC)    Past Surgical History:  Procedure Laterality Date   ABDOMINAL HYSTERECTOMY  2002   w/ Unilateral Salpingo-Oophrectomy   CESAREAN SECTION  1988   CYST REMOVAL HAND  1980s   CYSTOSCOPY/URETEROSCOPY/HOLMIUM LASER/STENT PLACEMENT Right 01/01/2017   Procedure: CYSTOSCOPY/ RETROGRADE/URETEROSCOPY/HOLMIUM LASER/STENT PLACEMENT;  Surgeon: Glenda Paci, MD;  Location: [facility];  Service: Urology;  Laterality: Right;  ONLY NEEDS 30 MIN FOR PROCEDURE   ROTATOR CUFF REPAIR Left 1990s   TONSILLECTOMY  child   UMBILICAL HERNIA REPAIR  1970s & 1990s   Patient Active Problem List   Diagnosis Date Noted   Chronic low back pain 09/16/2014   Common migraine 09/16/2014    Onset date: 08/07/2022  REFERRING DIAG: R49.0 (ICD-10-CM) - Hoarseness  THERAPY DIAG:  Hoarseness  Rationale for Evaluation and Treatment: Rehabilitation  SUBJECTIVE:   SUBJECTIVE STATEMENT: "Today my voice sounds decent."  Pt accompanied by: self  PERTINENT HISTORY: Glenda White is a 67 y.o. female who was referred for voice  therapy by Glenda Hacker, PA (ENT) and presents as a new patient for hoarseness which was first noted in February of 2024. Vocal quality fluctuates and is described as raspy. Per ENT: Denies vocal abuse. No history of heart burn or indigestion. Denies a history of recurrent sinusitis or thyroid dysfunction. Denies otalgia, otorrhea, ear pruritus, tinnitus, vertigo, dysphagia, odynophagia, globus sensation, chronic cough, oral regurgitation or laryngospasm. No nasal obstruction, purulent rhinorrhea, postnasal drip or facial pressure/pain. Chest x-ray on 01/21/22 was negative. Non-smoker. Drinks an occasional soda (no coffee). She was evaluated by ENT Aug 07, 2022 and was started on omeprazole, but switched to pantoprazole 08/29/2022, which she takes twice per day. She reports recent epistaxis which started 11/03/2022 and has occurred approximately 5x. She started taking Meloxicam on 10/27/2022 for her hand.  PAIN:  Are you having pain? No  FALLS: Has patient fallen in last 6 months? No, Number of falls: N/A  LIVING ENVIRONMENT: Lives with: lives with their spouse Lives in: House/apartment  PLOF:Level of assistance: Independent with ADLs, Independent with IADLs Employment: On disability, Retired  PATIENT GOALS: "Figure out why my voice changes."  OBJECTIVE:   DIAGNOSTIC FINDINGS:  Flexible Laryngoscopy Procedure Note:  <<Date: 08/07/22  Indications: hoarseness.  Risks, benefits and clinical relevance of the study were discussed. The patient understands and agrees to proceed.   Procedure: After adequate topical anesthetic was applied, 4 mm flexible laryngoscope was passed through  the left nasal cavity without difficulty. Flexible laryngoscopy shows patent anterior nasal cavity with minimal crusting, no discharge or infection. Nasopharynx normal.  Normal base of tongue and supraglottis. Normal vocal cord mobility without vocal cord nodule, mass, polyp or tumor. Hypopharynx normal without  mass, pooling of secretions or aspiration. Moderate post-glottic edema.   Patient tolerated procedure without complication or difficulty.  Glenda Hacker, PA-C  Impression & Plans:  Glenda White is a 66 y.o. female with history and fiberoptic exam of the larynx most consistent with laryngopharyngeal reflux. Dietary and lifestyle modifications for reflux recommended including avoidance of spicy and acidic foods which exacerbate reflux, avoiding late meals and snacking, avoiding caffeine and stimulants, avoiding carbonated and alcoholic beverages and sleeping with the head of bed elevated. Handout provided. Recommend a 68-month trial of once daily Omeprazole 40mg  taken 30 minutes before evening meals (states she does not eat a consistent breakfast). We will reach out to her in 2 months for an update on her symptoms. Also recommend checking TSH which was ordered.>>  COGNITION: Overall cognitive status: Within functional limits for tasks assessed Areas of impairment:  N/A  SOCIAL HISTORY: Occupation: Retired; Financial trader intake: optimal Caffeine/alcohol intake: minimal Daily voice use: moderate  PERCEPTUAL VOICE ASSESSMENT: Voice quality: normal Vocal abuse:  None per Pt Resonance: normal Respiratory function: diaphragmatic/abdominal breathing  Prosody  Speaking rate (reading): 165 words per minute (normal = 160-180 wpm)  Speaking rate (conversation): WFL Intonation: WFL Stress: WFL  Naturalness: WFL Intelligibility: Patient was 100% intelligible in a quiet room with an attentive listener  OBJECTIVE VOICE ASSESSMENT: Maximum phonation time for sustained "ah": 25 seconds WFL [Minimum: young female: 34 s, young fem: 67 s, elderly female: 86 s, elderly fem: 10 s Glenda White, Glenda White, Glenda White 1987)]  Conversational pitch average: 82.1 Hz Conversational pitch range: 52.9-162.7 Hz Conversational loudness average: 55 dB Conversational loudness range: 47-64 dB S/z ratio: 1.0 (Suggestive  of dysfunction >1.0) (s and z both about 13 seconds)  PATIENT REPORTED OUTCOME MEASURES (PROM): VHI: 11, EAT-10: 0, and RSI: 4 The Voice Handicap Index-10 (VHI-10) was administered. This survey is a series of questions targeting the patient's perception of his/her own voice using a scale of 0-4 (0=Never, 4=Always). Score greater than 11 is abnormal. My voice makes it difficult for people to hear me. 2  People have difficulty understanding me in a noisy room. 2  My voice difficulties restrict personal and social life. 1 I feel left out of conversations because of my voice. 0  My voice problem causes me to lose income. 0  I feel as though I have to strain to produce voice. 0  The clarity of my voice is unpredictable. 1  My voice problem upsets me. 1  My voice makes me feel handicapped. 1  People ask, "What's wrong with your voice?" 3  TOTAL SCORE:  [] Abnormal (raw score >11) [X]  Normal 11/40   EATING ASSESSMENT TOOL (EAT-10)   The patient was asked to rate to what extent the following statements are problematic on a scale of 0-4. 0 = No problem; 4 = Severe problem. A total score of 3 or higher is considered abnormal. My swallowing problem has caused me to lose weight. 0  My swallowing problem interferes with my ability to go out for meals. 0  Swallowing liquids takes extra effort. 0 Swallowing solids takes extra effort. 0  Swallowing pills takes extra effort. 0 Swallowing is painful. 0  The pleasure of eating is affected by my swallowing. 0  When I swallow food sticks in my throat. 0  I cough when I eat. 0 Swallowing is stressful. 0  TOTAL SCORE: 0 []  Abnormal (raw score >3) [X]  Normal    Reflux Symptom Index Hoarseness or a problem with your voice 3 Clearing your throat 1 Excess throat mucous or postnasal drip 0 Difficulty swallowing food, liquids, or pills 0 Coughing after you eat or after lying down 0 Breathing difficulties or choking episodes 0 Troublesome or annoying  cough 0 Sensation of something sticking in your throat or a lump in your throat 0 Heartburn, chest pain, indigestion, or stomach acid coming up 0 TOTAL SCORE: 4 []  Abnormal (raw score >13) [X]  Normal  Normative data suggests that an RSI of greater than or equal to 13 is clinically significant  Therefore, an RSI > 13 may be indicative of significant reflux disease.   TODAY'S TREATMENT: Evaluation completed this date.                                                                                                                                        DATE: 11/11/22   PATIENT EDUCATION: Education details: Pt was given written information regarding vocal hygiene and plan for keeping a voice journal Person educated: Patient Education method: Explanation and Handouts Education comprehension: verbalized understanding and needs further education  HOME EXERCISE PROGRAM: ***  GOALS: Goals reviewed with patient? Yes  SHORT TERM GOALS: Target date: ***  *** Baseline: Goal status: INITIAL  2.  *** Baseline:  Goal status: INITIAL  3.  *** Baseline:  Goal status: INITIAL  4.  *** Baseline:  Goal status: INITIAL   LONG TERM GOALS: Same as short term goals  ASSESSMENT:  CLINICAL IMPRESSION: Patient is a 67 y.o. female who was seen today for voice evaluation.   OBJECTIVE IMPAIRMENTS: include voice disorder. These impairments are limiting patient from effectively communicating at home and in community. Factors affecting potential to achieve goals and functional outcome are  variations in presentation . Patient will benefit from skilled SLP services to address above impairments and improve overall function.  REHAB POTENTIAL: Good  PLAN:  SLP FREQUENCY: 1x/week  SLP DURATION: 4 weeks  PLANNED INTERVENTIONS: Cueing hierachy, SLP instruction and feedback, Compensatory strategies, Patient/family education, and Re-evaluation   Thank you,  Havery Moros,  CCC-SLP [phone]  Kemi Gell, CCC-SLP 11/11/2022, 9:32 AM

## 2022-11-20 ENCOUNTER — Encounter (HOSPITAL_COMMUNITY): Payer: Self-pay | Admitting: Speech Pathology

## 2022-11-20 ENCOUNTER — Ambulatory Visit (HOSPITAL_COMMUNITY): Payer: Medicare Other | Attending: Physician Assistant | Admitting: Speech Pathology

## 2022-11-20 DIAGNOSIS — R490 Dysphonia: Secondary | ICD-10-CM | POA: Diagnosis not present

## 2022-11-20 DIAGNOSIS — R040 Epistaxis: Secondary | ICD-10-CM | POA: Diagnosis not present

## 2022-11-20 NOTE — Therapy (Signed)
OUTPATIENT SPEECH LANGUAGE PATHOLOGY VOICE TREATMENT   Patient Name: Glenda White MRN: [id number] DOB:[birth year], 67 y.o., female Today's Date: 11/20/2022  PCP: Glenda Perches, MD REFERRING PROVIDER: Aquilla Hacker, PA-C  END OF SESSION:  End of Session - 11/20/22 1241     Visit Number 2    Number of Visits 5    Date for SLP Re-Evaluation 11/14/22    Authorization Type [employer] Medicare    SLP Start Time 1125    SLP Stop Time  1212    SLP Time Calculation (min) 47 min    Activity Tolerance Patient tolerated treatment well             Past Medical History:  Diagnosis Date   Allergy    Arthritis    Cataract    Chronic low back pain 09/16/2014   Hyperlipidemia    Hypertension    Migraines    Obstruction of right ureteropelvic junction (UPJ) due to stone    Rotator cuff arthropathy of right shoulder    Type 2 diabetes mellitus (HCC)    Past Surgical History:  Procedure Laterality Date   ABDOMINAL HYSTERECTOMY  2002   w/ Unilateral Salpingo-Oophrectomy   CESAREAN SECTION  1988   CYST REMOVAL HAND  1980s   CYSTOSCOPY/URETEROSCOPY/HOLMIUM LASER/STENT PLACEMENT Right 01/01/2017   Procedure: CYSTOSCOPY/ RETROGRADE/URETEROSCOPY/HOLMIUM LASER/STENT PLACEMENT;  Surgeon: Rene Paci, MD;  Location: [facility];  Service: Urology;  Laterality: Right;  ONLY NEEDS 30 MIN FOR PROCEDURE   ROTATOR CUFF REPAIR Left 1990s   TONSILLECTOMY  child   UMBILICAL HERNIA REPAIR  1970s & 1990s   Patient Active Problem List   Diagnosis Date Noted   Chronic low back pain 09/16/2014   Common migraine 09/16/2014    Onset date: 08/07/2022  REFERRING DIAG: R49.0 (ICD-10-CM) - Hoarseness  THERAPY DIAG:  Hoarseness  Rationale for Evaluation and Treatment: Rehabilitation  SUBJECTIVE:   SUBJECTIVE STATEMENT: "My voice is a little hoarse right now."  Pt accompanied by: self  PERTINENT HISTORY: Glenda White is a 67 y.o. female who was referred for  voice therapy by Glenda Hacker, PA (ENT) and presents as a new patient for hoarseness which was first noted in February of 2024. Vocal quality fluctuates and is described as raspy. Per ENT: Denies vocal abuse. No history of heart burn or indigestion. Denies a history of recurrent sinusitis or thyroid dysfunction. Denies otalgia, otorrhea, ear pruritus, tinnitus, vertigo, dysphagia, odynophagia, globus sensation, chronic cough, oral regurgitation or laryngospasm. No nasal obstruction, purulent rhinorrhea, postnasal drip or facial pressure/pain. Chest x-ray on 01/21/22 was negative. Non-smoker. Drinks an occasional soda (no coffee). She was evaluated by ENT Aug 07, 2022 and was started on omeprazole, but switched to pantoprazole 08/29/2022, which she takes twice per day. She reports recent epistaxis which started 11/03/2022 and has occurred approximately 5x. She started taking Meloxicam on 10/27/2022 for her hand.  PAIN:  Are you having pain? No  FALLS: Has patient fallen in last 6 months? No, Number of falls: N/A  LIVING ENVIRONMENT: Lives with: lives with their spouse Lives in: House/apartment  PLOF:Level of assistance: Independent with ADLs, Independent with IADLs Employment: On disability, Retired  PATIENT GOALS: "Figure out why my voice changes."  OBJECTIVE:   DIAGNOSTIC FINDINGS:  Flexible Laryngoscopy Procedure Note:  <<Date: 08/07/22  Indications: hoarseness.  Risks, benefits and clinical relevance of the study were discussed. The patient understands and agrees to proceed.   Procedure: After adequate topical anesthetic was applied, 4 mm flexible laryngoscope  was passed through the left nasal cavity without difficulty. Flexible laryngoscopy shows patent anterior nasal cavity with minimal crusting, no discharge or infection. Nasopharynx normal.  Normal base of tongue and supraglottis. Normal vocal cord mobility without vocal cord nodule, mass, polyp or tumor. Hypopharynx normal  without mass, pooling of secretions or aspiration. Moderate post-glottic edema.   Patient tolerated procedure without complication or difficulty.  Glenda Hacker, PA-C  Impression & Plans:  Glenda White is a 67 y.o. female with history and fiberoptic exam of the larynx most consistent with laryngopharyngeal reflux. Dietary and lifestyle modifications for reflux recommended including avoidance of spicy and acidic foods which exacerbate reflux, avoiding late meals and snacking, avoiding caffeine and stimulants, avoiding carbonated and alcoholic beverages and sleeping with the head of bed elevated. Handout provided. Recommend a 62-month trial of once daily Omeprazole 40mg  taken 30 minutes before evening meals (states she does not eat a consistent breakfast). We will reach out to her in 2 months for an update on her symptoms. Also recommend checking TSH which was ordered.>>  COGNITION: Overall cognitive status: Within functional limits for tasks assessed Areas of impairment:  N/A  SOCIAL HISTORY: Occupation: Retired; Financial trader intake: optimal Caffeine/alcohol intake: minimal Daily voice use: moderate  PERCEPTUAL VOICE ASSESSMENT: Voice quality: normal Vocal abuse:  None per Pt Resonance: normal Respiratory function: diaphragmatic/abdominal breathing  Prosody  Speaking rate (reading): 165 words per minute (normal = 160-180 wpm)  Speaking rate (conversation): WFL Intonation: WFL Stress: WFL  Naturalness: WFL Intelligibility: Patient was 100% intelligible in a quiet room with an attentive listener  OBJECTIVE VOICE ASSESSMENT: Maximum phonation time for sustained "ah": 25 seconds WFL [Minimum: young female: 55 s, young fem: 28 s, elderly female: 21 s, elderly fem: 10 s Rudell Cobb, [place], [place] 1987)]  Conversational pitch average: 82.1 Hz (low) Conversational pitch range: 52.9-162.7 Hz Conversational loudness average: 55 dB Conversational loudness range: 47-64 dB S/z ratio:  1.0 (Suggestive of dysfunction >1.0) (s and z both about 13 seconds)  PATIENT REPORTED OUTCOME MEASURES (PROM): VHI: 11, EAT-10: 0, and RSI: 4 The Voice Handicap Index-10 (VHI-10) was administered. This survey is a series of questions targeting the patient's perception of his/her own voice using a scale of 0-4 (0=Never, 4=Always). Score greater than 11 is abnormal. My voice makes it difficult for people to hear me. 2  People have difficulty understanding me in a noisy room. 2  My voice difficulties restrict personal and social life. 1 I feel left out of conversations because of my voice. 0  My voice problem causes me to lose income. 0  I feel as though I have to strain to produce voice. 0  The clarity of my voice is unpredictable. 1  My voice problem upsets me. 1  My voice makes me feel handicapped. 1  People ask, "What's wrong with your voice?" 3  TOTAL SCORE:  [] Abnormal (raw score >11) [X]  Normal 11/40   EATING ASSESSMENT TOOL (EAT-10)   The patient was asked to rate to what extent the following statements are problematic on a scale of 0-4. 0 = No problem; 4 = Severe problem. A total score of 3 or higher is considered abnormal. My swallowing problem has caused me to lose weight. 0  My swallowing problem interferes with my ability to go out for meals. 0  Swallowing liquids takes extra effort. 0 Swallowing solids takes extra effort. 0  Swallowing pills takes extra effort. 0 Swallowing is painful. 0  The pleasure of eating is affected  by my swallowing. 0  When I swallow food sticks in my throat. 0  I cough when I eat. 0 Swallowing is stressful. 0  TOTAL SCORE: 0 []  Abnormal (raw score >3) [X]  Normal    Reflux Symptom Index Hoarseness or a problem with your voice 3 Clearing your throat 1 Excess throat mucous or postnasal drip 0 Difficulty swallowing food, liquids, or pills 0 Coughing after you eat or after lying down 0 Breathing difficulties or choking episodes  0 Troublesome or annoying cough 0 Sensation of something sticking in your throat or a lump in your throat 0 Heartburn, chest pain, indigestion, or stomach acid coming up 0 TOTAL SCORE: 4 []  Abnormal (raw score >13) [X]  Normal  Normative data suggests that an RSI of greater than or equal to 13 is clinically significant  Therefore, an RSI > 13 may be indicative of significant reflux disease.   PATIENT EDUCATION: Education details: Pt was given written information regarding vocal function exercises. Person educated: Patient Education method: Explanation and Handouts Education comprehension: verbalized understanding and needs further education  HOME EXERCISE PROGRAM: Pt will complete HEP as assigned to facilitate carryover of treatment strategies and techniques in home environment with use of written cues.   GOALS: Goals reviewed with patient? Yes  SHORT TERM GOALS: Target date: 12/17/2022  Regularly practice voice building/strengthening exercises a minimum of 5 days/week for 20+ minutes a day. Baseline: Introduced Goal status: ONGOING  2.  9. Report understanding of management of laryngopharyngeal reflux through dietary and behavioral modifications. a. Take reflux medication correctly 80% of the time (30-60 minutes before a meal) b. Elevate the head of the bed by 4-6" c. Avoid eating 2-3 hours before lying down 80% of the time  Baseline: Pt reports good adherence to medication protocol, new onset epistaxis Goal status: ONGOING  3.  The patient will implement appropriate vocal hygiene recommendations on a daily basis.  Baseline: No hoarseness observed today, reassess next session and implement the above. Goal status: ONGOING   LONG TERM GOALS: Same as short term goals  ASSESSMENT:  CLINICAL IMPRESSION: (from evaluation on 11/11/2022) Patient is a 67 y.o. female who was seen today for voice evaluation. Pt was seen by ENT in May for vocal hoarseness and fiberoptic exam revealed  Moderate post-glottic edema most likely consistent with reflux. Pt was started on  PPI and she reports no noticeable change, however her voice is not hoarse today. Pt indicates that her voice sounds "decent" today with a self rating of 9/10 (normal is 10), but can be as bad as a 5/10 several days a week. She denies reflux symptoms, drinks ~64 oz of water per day, limited caffeine (green tea), an occasional soda (2x/week), no throat clearing, and no pain or discomfort with voice/swallow. 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 She notes a new onset of "nose bleeding" which occurred 5x since 11/03/2022. She indicates that she started taking Meloxicam on 10/27/2022 for her hand. Overall, Pt reports sudden onset vocal hoarseness in February 2024 without improvement, but only occurs a few days a week. Pt demonstrates adequate breath support for speech. Recommend trial period SLP/Voice therapy 1x/week for ~3 weeks to address the above stated goals.   OBJECTIVE IMPAIRMENTS: include voice disorder. These impairments are limiting patient from effectively communicating at home and in community. Factors affecting potential to achieve  goals and functional outcome are  variations in presentation . Patient will benefit from skilled SLP services to address above impairments and improve overall function.  REHAB POTENTIAL: Good  TODAY'S TREATMENT:  Pt reports mild hoarseness this date and it was noticed by SLP when entering the treatment room. SLP noted intermittent glottal fry (very slight) that was only present ~15% of the time. Pt saw the ENT this morning due to episodes of epistaxis, the last being on 8/29 and 11/15/2022. She runs a humidifier and air purifier at home. She doesn't feel like she has noticed any changes since being on PPI since June, but has not tried raising the head of  her bed. She sleeps on her left side and indicates that she will try to elevate the HOB. SLP led Pt through focal warm up and vocal function exercises and Pt able to return demonstrate with mi/mod assist. She was able to identify when her vocal quality changed in conversational speech. Pt had difficulty completing lip and tongue trill and she was given a straw to practice straw phonation at home. Will lead through Pt through vocal fold unloading exercises again next session.                                                                                                                                      DATE: 11/20/22   PLAN:  SLP FREQUENCY: 1x/week  SLP DURATION: 4 weeks  PLANNED INTERVENTIONS: Cueing hierachy, SLP instruction and feedback, Compensatory strategies, Patient/family education, and Re-evaluation   Thank you,  Havery Moros, CCC-SLP [phone]  Tayanna Talford, CCC-SLP 11/20/2022, 12:41 PM

## 2022-11-27 ENCOUNTER — Encounter (HOSPITAL_COMMUNITY): Payer: Self-pay | Admitting: Speech Pathology

## 2022-11-27 ENCOUNTER — Ambulatory Visit (HOSPITAL_COMMUNITY): Payer: Medicare Other | Admitting: Speech Pathology

## 2022-11-27 DIAGNOSIS — R490 Dysphonia: Secondary | ICD-10-CM | POA: Diagnosis not present

## 2022-11-27 NOTE — Therapy (Signed)
OUTPATIENT SPEECH LANGUAGE PATHOLOGY VOICE TREATMENT   Patient Name: Glenda White MRN: [id number] DOB:10-22-1955, 67 y.o., female Today's Date: 11/27/2022  PCP: Glenda Perches, MD REFERRING PROVIDER: Aquilla Hacker, PA-C  END OF SESSION:  End of Session - 11/27/22 0914     Visit Number 3    Number of Visits 5    Date for SLP Re-Evaluation 12/17/22    Authorization Type [employer] Medicare    SLP Start Time 0900    SLP Stop Time  0945    SLP Time Calculation (min) 45 min    Activity Tolerance Patient tolerated treatment well             Past Medical History:  Diagnosis Date   Allergy    Arthritis    Cataract    Chronic low back pain 09/16/2014   Hyperlipidemia    Hypertension    Migraines    Obstruction of right ureteropelvic junction (UPJ) due to stone    Rotator cuff arthropathy of right shoulder    Type 2 diabetes mellitus (HCC)    Past Surgical History:  Procedure Laterality Date   ABDOMINAL HYSTERECTOMY  2002   w/ Unilateral Salpingo-Oophrectomy   CESAREAN SECTION  1988   CYST REMOVAL HAND  1980s   CYSTOSCOPY/URETEROSCOPY/HOLMIUM LASER/STENT PLACEMENT Right 01/01/2017   Procedure: CYSTOSCOPY/ RETROGRADE/URETEROSCOPY/HOLMIUM LASER/STENT PLACEMENT;  Surgeon: Glenda Paci, MD;  Location: [facility];  Service: Urology;  Laterality: Right;  ONLY NEEDS 30 MIN FOR PROCEDURE   ROTATOR CUFF REPAIR Left 1990s   TONSILLECTOMY  child   UMBILICAL HERNIA REPAIR  1970s & 1990s   Patient Active Problem List   Diagnosis Date Noted   Chronic low back pain 09/16/2014   Common migraine 09/16/2014    Onset date: 08/07/2022  REFERRING DIAG: R49.0 (ICD-10-CM) - Hoarseness  THERAPY DIAG:  Hoarseness  Rationale for Evaluation and Treatment: Rehabilitation  SUBJECTIVE:   SUBJECTIVE STATEMENT: "Today is a good for you to see me because my voice is scratchy."  Pt accompanied by: self  PERTINENT HISTORY: Glenda White is a 67 y.o.  female who was referred for voice therapy by Glenda Hacker, PA (ENT) and presents as a new patient for hoarseness which was first noted in February of 2024. Vocal quality fluctuates and is described as raspy. Per ENT: Denies vocal abuse. No history of heart burn or indigestion. Denies a history of recurrent sinusitis or thyroid dysfunction. Denies otalgia, otorrhea, ear pruritus, tinnitus, vertigo, dysphagia, odynophagia, globus sensation, chronic cough, oral regurgitation or laryngospasm. No nasal obstruction, purulent rhinorrhea, postnasal drip or facial pressure/pain. Chest x-ray on 01/21/22 was negative. Non-smoker. Drinks an occasional soda (no coffee). She was evaluated by ENT Aug 07, 2022 and was started on omeprazole, but switched to pantoprazole 08/29/2022, which she takes twice per day. She reports recent epistaxis which started 11/03/2022 and has occurred approximately 5x. She started taking Meloxicam on 10/27/2022 for her hand.  PAIN:  Are you having pain? No  FALLS: Has patient fallen in last 6 months? No, Number of falls: N/A  LIVING ENVIRONMENT: Lives with: lives with their spouse Lives in: House/apartment  PLOF:Level of assistance: Independent with ADLs, Independent with IADLs Employment: On disability, Retired  PATIENT GOALS: "Figure out why my voice changes."  OBJECTIVE:   DIAGNOSTIC FINDINGS:  Flexible Laryngoscopy Procedure Note:  <<Date: 08/07/22  Indications: hoarseness.  Risks, benefits and clinical relevance of the study were discussed. The patient understands and agrees to proceed.   Procedure: After adequate topical anesthetic  was applied, 4 mm flexible laryngoscope was passed through the left nasal cavity without difficulty. Flexible laryngoscopy shows patent anterior nasal cavity with minimal crusting, no discharge or infection. Nasopharynx normal.  Normal base of tongue and supraglottis. Normal vocal cord mobility without vocal cord nodule, mass, polyp or  tumor. Hypopharynx normal without mass, pooling of secretions or aspiration. Moderate post-glottic edema.   Patient tolerated procedure without complication or difficulty.  Glenda Hacker, PA-C  Impression & Plans:  Glenda White is a 67 y.o. female with history and fiberoptic exam of the larynx most consistent with laryngopharyngeal reflux. Dietary and lifestyle modifications for reflux recommended including avoidance of spicy and acidic foods which exacerbate reflux, avoiding late meals and snacking, avoiding caffeine and stimulants, avoiding carbonated and alcoholic beverages and sleeping with the head of bed elevated. Handout provided. Recommend a 26-month trial of once daily Omeprazole 40mg  taken 30 minutes before evening meals (states she does not eat a consistent breakfast). We will reach out to her in 2 months for an update on her symptoms. Also recommend checking TSH which was ordered.>>  PERCEPTUAL VOICE ASSESSMENT: Voice quality: normal Vocal abuse:  None per Pt Resonance: normal Respiratory function: diaphragmatic/abdominal breathing  Prosody  Speaking rate (reading): 165 words per minute (normal = 160-180 wpm)  Speaking rate (conversation): WFL Intonation: WFL Stress: WFL  Naturalness: WFL Intelligibility: Patient was 100% intelligible in a quiet room with an attentive listener  OBJECTIVE VOICE ASSESSMENT: Maximum phonation time for sustained "ah": 25 seconds WFL [Minimum: young female: 80 s, young fem: 2 s, elderly female: 35 s, elderly fem: 10 s Rudell Cobb, [place], [place] 1987)]  Conversational pitch average: 82.1 Hz (low) Conversational pitch range: 52.9-162.7 Hz Conversational loudness average: 55 dB Conversational loudness range: 47-64 dB S/z ratio: 1.0 (Suggestive of dysfunction >1.0) (s and z both about 13 seconds)   PATIENT EDUCATION: Education details: Practice vocal fold unloading tasks especially when immediate "scratchiness" occurs.  Person educated:  Patient Education method: Explanation and Handouts Education comprehension: verbalized understanding and needs further education  HOME EXERCISE PROGRAM: Pt will complete HEP as assigned to facilitate carryover of treatment strategies and techniques in home environment with use of written cues.   GOALS: Goals reviewed with patient? Yes  SHORT TERM GOALS: Target date: 12/17/2022  Regularly practice voice building/strengthening exercises a minimum of 5 days/week for 20+ minutes a day. Baseline: Introduced Goal status: ONGOING  2.  9. Report understanding of management of laryngopharyngeal reflux through dietary and behavioral modifications. a. Take reflux medication correctly 80% of the time (30-60 minutes before a meal) b. Elevate the head of the bed by 4-6" c. Avoid eating 2-3 hours before lying down 80% of the time  Baseline: Pt reports good adherence to medication protocol, new onset epistaxis Goal status: ONGOING  3.  The patient will implement appropriate vocal hygiene recommendations on a daily basis.  Baseline: No hoarseness observed today, reassess next session and implement the above. Goal status: ONGOING   LONG TERM GOALS: Same as short term goals  ASSESSMENT:  CLINICAL IMPRESSION: (from evaluation on 11/11/2022) Patient is a 67 y.o. female who was seen today for voice evaluation. Pt was seen by ENT in May for vocal hoarseness and fiberoptic exam revealed Moderate post-glottic edema most likely consistent with reflux. Pt was started on  PPI and she reports no noticeable change, however her voice is not hoarse today. Pt indicates that her voice sounds "decent" today with a self rating of 9/10 (normal is 10), but can  be as bad as a 5/10 several days a week. She denies reflux symptoms, drinks ~64 oz of water per day, limited caffeine (green tea), an occasional soda (2x/week), no throat clearing, and no pain or discomfort with voice/swallow. She uses her voice a moderate amount  during the day and does cheer/yell at the local high school football games (did not attend over the summer and no change in hoarseness per Pt). She notes a new onset of "nose bleeding" which occurred 5x since 11/03/2022. She indicates that she started taking Meloxicam on 10/27/2022 for her hand. Overall, Pt reports sudden onset vocal hoarseness in February 2024 without improvement, but only occurs a few days a week. Pt demonstrates adequate breath support for speech. Recommend trial period SLP/Voice therapy 1x/week for ~3 weeks to address the above stated goals.   OBJECTIVE IMPAIRMENTS: include voice disorder. These impairments are limiting patient from effectively communicating at home and in community. Factors affecting potential to achieve goals and functional outcome are  variations in presentation . Patient will benefit from skilled SLP services to address above impairments and improve overall function.  REHAB POTENTIAL: Good  PREVIOUS TREATMENT:  Pt reports mild hoarseness this date and it was noticed by SLP when entering the treatment room. SLP noted intermittent glottal fry (very slight) that was only present ~15% of the time. Pt saw the ENT this morning due to episodes of epistaxis, the last being on 8/29 and 11/15/2022. She runs a humidifier and air purifier at home. She doesn't feel like she has noticed any changes since being on PPI since June, but has not tried raising the head of her bed. She sleeps on her left side and indicates that she will try to elevate the HOB. SLP led Pt through focal warm up and vocal function exercises and Pt able to return demonstrate with mi/mod assist. She was able to identify when her vocal quality changed in conversational speech. Pt had difficulty completing lip and tongue trill and she was given a straw to practice straw phonation at home. Will lead through Pt through vocal fold unloading exercises again next session.   CURRENT TREATMENT: Pt entered SLP room with  notable "scratchiness" to her voice. She stated that she knew today would be a good day for me to see her because it has been intermittently "scratchy" and "hoarse" this morning. She said the same to her husband before she left the house and he commented that it seemed to be better than earlier this AM. SLP encouraged Pt throughout the session to identify immediately when the shift occurs in her voice and stop to re-set with strategies provided by SLP. Pt initially hoarse, but then shifted to "normal" quality after ~one minute. SLP then recorded Pt for several minutes and we went back and reviewed together to see if Pt could identify the shift in her quality (she could). This shift occurred when she was laughing and talking simultaneously, suggestive of increased laryngeal tension and reduced breath support. Pt also noted that this occurs when she is speaking quickly (likely due to altered breath support). SLP provided direct modeling and cues for vocal fold unloading tasks (yawn sigh, straw phonation, easy onset, etc). Pt able to return demonstrate after three rounds of modeling provided. Pt encouraged to practice all strategies at home and to pay particular attention to when she hears her voice shift (to stop and unload and then begin talking again). She reports that she has not had a nose bleed since she saw  the ENT. Plan to continue targeting goals next session.                                                                                                                                      DATE: 11/27/22   PLAN:  SLP FREQUENCY: 1x/week  SLP DURATION: 4 weeks  PLANNED INTERVENTIONS: Cueing hierachy, SLP instruction and feedback, Compensatory strategies, Patient/family education, and Re-evaluation   Thank you,  Havery Moros, CCC-SLP [phone]  Fredrica Capano, CCC-SLP 11/27/2022, 9:27 AM

## 2022-12-02 DIAGNOSIS — M79642 Pain in left hand: Secondary | ICD-10-CM | POA: Diagnosis not present

## 2022-12-02 DIAGNOSIS — M79641 Pain in right hand: Secondary | ICD-10-CM | POA: Diagnosis not present

## 2022-12-02 DIAGNOSIS — M76821 Posterior tibial tendinitis, right leg: Secondary | ICD-10-CM | POA: Diagnosis not present

## 2022-12-04 ENCOUNTER — Ambulatory Visit (HOSPITAL_COMMUNITY): Payer: Medicare Other | Admitting: Speech Pathology

## 2022-12-04 DIAGNOSIS — R490 Dysphonia: Secondary | ICD-10-CM

## 2022-12-04 NOTE — Patient Instructions (Signed)
Semi-occluded vocal tract exercises (SOVTE)  These allow your vocal folds to vibrate without excess tension and promotes high placement of the voice  Use SOVTE as a warm up before prolonged speaking and vocal exercises   High resistance: voicing through a stirring straw  Medium resistance: voicing through a drinking straw  Less resistance: Voiced /v/                            Lip or Tongue Trill                            Nasal "hums" /m/ and /n/                            Vowels /u/ and ee  Watch Vocal Straw Exercises with Lenox Ahr on YouTube:  [email or web address]  Pitch Glides for 2 minutes  Accents (siren)  Hum the [employer]  A goal would be 2-3 minutes several times a day and prior to vocal exercises  As always, use good belly breathing while completing SOVTE

## 2022-12-04 NOTE — Therapy (Signed)
OUTPATIENT SPEECH LANGUAGE PATHOLOGY VOICE TREATMENT   Patient Name: Glenda White MRN: [id number] DOB:29-Jul-1955, 67 y.o., female Today's Date: 12/04/2022  PCP: Carylon Perches, MD REFERRING PROVIDER: Aquilla Hacker, PA-C  END OF SESSION:  End of Session - 12/04/22 1120     Visit Number 4    Number of Visits 5    Date for SLP Re-Evaluation 12/17/22    Authorization Type [employer] Medicare   [facility] approved 4 visits from 11/17/2022-12/09/2022 ([id number])   SLP Start Time 1100    SLP Stop Time  1145    SLP Time Calculation (min) 45 min    Activity Tolerance Patient tolerated treatment well             Past Medical History:  Diagnosis Date   Allergy    Arthritis    Cataract    Chronic low back pain 09/16/2014   Hyperlipidemia    Hypertension    Migraines    Obstruction of right ureteropelvic junction (UPJ) due to stone    Rotator cuff arthropathy of right shoulder    Type 2 diabetes mellitus (HCC)    Past Surgical History:  Procedure Laterality Date   ABDOMINAL HYSTERECTOMY  2002   w/ Unilateral Salpingo-Oophrectomy   CESAREAN SECTION  1988   CYST REMOVAL HAND  1980s   CYSTOSCOPY/URETEROSCOPY/HOLMIUM LASER/STENT PLACEMENT Right 01/01/2017   Procedure: CYSTOSCOPY/ RETROGRADE/URETEROSCOPY/HOLMIUM LASER/STENT PLACEMENT;  Surgeon: Rene Paci, MD;  Location: [facility];  Service: Urology;  Laterality: Right;  ONLY NEEDS 30 MIN FOR PROCEDURE   ROTATOR CUFF REPAIR Left 1990s   TONSILLECTOMY  child   UMBILICAL HERNIA REPAIR  1970s & 1990s   Patient Active Problem List   Diagnosis Date Noted   Chronic low back pain 09/16/2014   Common migraine 09/16/2014    Onset date: 08/07/2022  REFERRING DIAG: R49.0 (ICD-10-CM) - Hoarseness  THERAPY DIAG:  Hoarseness  Rationale for Evaluation and Treatment: Rehabilitation  SUBJECTIVE:   SUBJECTIVE STATEMENT: "My voice has been better this week."  Pt accompanied by: self  PERTINENT  HISTORY: Glenda White is a 67 y.o. female who was referred for voice therapy by Aquilla Hacker, PA (ENT) and presents as a new patient for hoarseness which was first noted in February of 2024. Vocal quality fluctuates and is described as raspy. Per ENT: Denies vocal abuse. No history of heart burn or indigestion. Denies a history of recurrent sinusitis or thyroid dysfunction. Denies otalgia, otorrhea, ear pruritus, tinnitus, vertigo, dysphagia, odynophagia, globus sensation, chronic cough, oral regurgitation or laryngospasm. No nasal obstruction, purulent rhinorrhea, postnasal drip or facial pressure/pain. Chest x-ray on 01/21/22 was negative. Non-smoker. Drinks an occasional soda (no coffee). She was evaluated by ENT Aug 07, 2022 and was started on omeprazole, but switched to pantoprazole 08/29/2022, which she takes twice per day. She reports recent epistaxis which started 11/03/2022 and has occurred approximately 5x. She started taking Meloxicam on 10/27/2022 for her hand.  PAIN:  Are you having pain? No  FALLS: Has patient fallen in last 6 months? No, Number of falls: N/A  LIVING ENVIRONMENT: Lives with: lives with their spouse Lives in: House/apartment  PLOF:Level of assistance: Independent with ADLs, Independent with IADLs Employment: On disability, Retired  PATIENT GOALS: "Figure out why my voice changes."  OBJECTIVE:   DIAGNOSTIC FINDINGS:  Flexible Laryngoscopy Procedure Note:  <<Date: 08/07/22  Indications: hoarseness.  Risks, benefits and clinical relevance of the study were discussed. The patient understands and agrees to proceed.   Procedure: After adequate  topical anesthetic was applied, 4 mm flexible laryngoscope was passed through the left nasal cavity without difficulty. Flexible laryngoscopy shows patent anterior nasal cavity with minimal crusting, no discharge or infection. Nasopharynx normal.  Normal base of tongue and supraglottis. Normal vocal cord mobility  without vocal cord nodule, mass, polyp or tumor. Hypopharynx normal without mass, pooling of secretions or aspiration. Moderate post-glottic edema.   Patient tolerated procedure without complication or difficulty.  Aquilla Hacker, PA-C  Impression & Plans:  Glenda White is a 67 y.o. female with history and fiberoptic exam of the larynx most consistent with laryngopharyngeal reflux. Dietary and lifestyle modifications for reflux recommended including avoidance of spicy and acidic foods which exacerbate reflux, avoiding late meals and snacking, avoiding caffeine and stimulants, avoiding carbonated and alcoholic beverages and sleeping with the head of bed elevated. Handout provided. Recommend a 36-month trial of once daily Omeprazole 40mg  taken 30 minutes before evening meals (states she does not eat a consistent breakfast). We will reach out to her in 2 months for an update on her symptoms. Also recommend checking TSH which was ordered.>>  PERCEPTUAL VOICE ASSESSMENT: Voice quality: normal Vocal abuse:  None per Pt Resonance: normal Respiratory function: diaphragmatic/abdominal breathing  Prosody  Speaking rate (reading): 165 words per minute (normal = 160-180 wpm)  Speaking rate (conversation): WFL Intonation: WFL Stress: WFL  Naturalness: WFL Intelligibility: Patient was 100% intelligible in a quiet room with an attentive listener  OBJECTIVE VOICE ASSESSMENT: Maximum phonation time for sustained "ah": 25 seconds WFL [Minimum: young female: 4 s, young fem: 45 s, elderly female: 27 s, elderly fem: 10 s Rudell Cobb, [place], [place] 1987)]  Conversational pitch average: 82.1 Hz (low) Conversational pitch range: 52.9-162.7 Hz Conversational loudness average: 55 dB Conversational loudness range: 47-64 dB S/z ratio: 1.0 (Suggestive of dysfunction >1.0) (s and z both about 13 seconds)   PATIENT EDUCATION: Education details: Practice vocal fold unloading tasks especially when immediate  "scratchiness" occurs.  Person educated: Patient Education method: Explanation and Handouts Education comprehension: verbalized understanding and needs further education  HOME EXERCISE PROGRAM: Pt will complete HEP as assigned to facilitate carryover of treatment strategies and techniques in home environment with use of written cues.   GOALS: Goals reviewed with patient? Yes  SHORT TERM GOALS: Target date: 12/17/2022  Regularly practice voice building/strengthening exercises a minimum of 5 days/week for 20+ minutes a day. Baseline: Introduced Goal status: MET  2.  9. Report understanding of management of laryngopharyngeal reflux through dietary and behavioral modifications. a. Take reflux medication correctly 80% of the time (30-60 minutes before a meal) b. Elevate the head of the bed by 4-6" c. Avoid eating 2-3 hours before lying down 80% of the time  Baseline: Pt reports good adherence to medication protocol, new onset epistaxis Goal status: MET  3.  The patient will implement appropriate vocal hygiene recommendations on a daily basis.  Baseline: No hoarseness observed today, reassess next session and implement the above. Goal status: MET   LONG TERM GOALS: Same as short term goals  ASSESSMENT:  CLINICAL IMPRESSION: (from evaluation on 11/11/2022) Patient is a 67 y.o. female who was seen today for voice evaluation. Pt was seen by ENT in May for vocal hoarseness and fiberoptic exam revealed Moderate post-glottic edema most likely consistent with reflux. Pt was started on  PPI and she reports no noticeable change, however her voice is not hoarse today. Pt indicates that her voice sounds "decent" today with a self rating of 9/10 (normal is 10),  but can be as bad as a 5/10 several days a week. She denies reflux symptoms, drinks ~64 oz of water per day, limited caffeine (green tea), an occasional soda (2x/week), no throat clearing, and no pain or discomfort with voice/swallow. She uses  her voice a moderate amount during the day and does cheer/yell at the local high school football games (did not attend over the summer and no change in hoarseness per Pt). She notes a new onset of "nose bleeding" which occurred 5x since 11/03/2022. She indicates that she started taking Meloxicam on 10/27/2022 for her hand. Overall, Pt reports sudden onset vocal hoarseness in February 2024 without improvement, but only occurs a few days a week. Pt demonstrates adequate breath support for speech. Recommend trial period SLP/Voice therapy 1x/week for ~3 weeks to address the above stated goals.   OBJECTIVE IMPAIRMENTS: include voice disorder. These impairments are limiting patient from effectively communicating at home and in community. Factors affecting potential to achieve goals and functional outcome are  variations in presentation . Patient will benefit from skilled SLP services to address above impairments and improve overall function.  REHAB POTENTIAL: Good  PREVIOUS TREATMENT: Pt entered SLP room with notable "scratchiness" to her voice. She stated that she knew today would be a good day for me to see her because it has been intermittently "scratchy" and "hoarse" this morning. She said the same to her husband before she left the house and he commented that it seemed to be better than earlier this AM. SLP encouraged Pt throughout the session to identify immediately when the shift occurs in her voice and stop to re-set with strategies provided by SLP. Pt initially hoarse, but then shifted to "normal" quality after ~one minute. SLP then recorded Pt for several minutes and we went back and reviewed together to see if Pt could identify the shift in her quality (she could). This shift occurred when she was laughing and talking simultaneously, suggestive of increased laryngeal tension and reduced breath support. Pt also noted that this occurs when she is speaking quickly (likely due to altered breath support). SLP  provided direct modeling and cues for vocal fold unloading tasks (yawn sigh, straw phonation, easy onset, etc). Pt able to return demonstrate after three rounds of modeling provided. Pt encouraged to practice all strategies at home and to pay particular attention to when she hears her voice shift (to stop and unload and then begin talking again). She reports that she has not had a nose bleed since she saw the ENT. Plan to continue targeting goals next session.    CURRENT TREATMENT: Pt reports reduced hoarseness this week and no further nose bleeds. She states that she has been trying to find reasons for hoarseness (environmental triggers, cheering at the football game, etc), but does not see any correlations. She completed vocal function exercises with cues from SLP and then also the semi-occluded vocal tract exercises (SOVTE). SLP guided Pt through diaphragmatic breathing exercises and Pt needed mod cues for abdominal expansion on the inhalation via hand over her hand on belly. She was encouraged to practice this at home laying down and was given additional written information to complete at home. No overt hoarseness observed today, however some slight "scratchiness" on occasion. Pt stated that she doesn't care if her voice is hoarse as long as "there is no cancer". SLP encouraged Pt to continue with the exercises given and to identify hoarseness when it occurs and eliminate it by the SOVTE, which she has  been successful with. Pt is pleased with progress and will be discharged from SLP services at this time.                                                                                                                                      DATE: 12/04/22   PLAN:  SPEECH THERAPY DISCHARGE SUMMARY  Visits from Start of Care: 5  Current functional level related to goals / functional outcomes: Goals met, see above   Remaining deficits: Rare/min occasional hoarseness   Education / Equipment: HEP  provided   Patient agrees to discharge. Patient goals were met. Patient is being discharged due to meeting the stated rehab goals..     Thank you,  Havery Moros, CCC-SLP [phone]  Idalee Foxworthy, CCC-SLP 12/04/2022, 11:24 AM

## 2022-12-16 DIAGNOSIS — M79675 Pain in left toe(s): Secondary | ICD-10-CM | POA: Diagnosis not present

## 2022-12-16 DIAGNOSIS — M79674 Pain in right toe(s): Secondary | ICD-10-CM | POA: Diagnosis not present

## 2022-12-16 DIAGNOSIS — M79671 Pain in right foot: Secondary | ICD-10-CM | POA: Diagnosis not present

## 2022-12-16 DIAGNOSIS — M79672 Pain in left foot: Secondary | ICD-10-CM | POA: Diagnosis not present

## 2022-12-16 DIAGNOSIS — E1140 Type 2 diabetes mellitus with diabetic neuropathy, unspecified: Secondary | ICD-10-CM | POA: Diagnosis not present

## 2022-12-16 DIAGNOSIS — L110 Acquired keratosis follicularis: Secondary | ICD-10-CM | POA: Diagnosis not present

## 2022-12-17 DIAGNOSIS — E118 Type 2 diabetes mellitus with unspecified complications: Secondary | ICD-10-CM | POA: Diagnosis not present

## 2022-12-24 DIAGNOSIS — M1990 Unspecified osteoarthritis, unspecified site: Secondary | ICD-10-CM | POA: Diagnosis not present

## 2022-12-24 DIAGNOSIS — E1169 Type 2 diabetes mellitus with other specified complication: Secondary | ICD-10-CM | POA: Diagnosis not present

## 2022-12-24 DIAGNOSIS — R7309 Other abnormal glucose: Secondary | ICD-10-CM | POA: Diagnosis not present

## 2022-12-24 DIAGNOSIS — I10 Essential (primary) hypertension: Secondary | ICD-10-CM | POA: Diagnosis not present

## 2022-12-24 DIAGNOSIS — Z23 Encounter for immunization: Secondary | ICD-10-CM | POA: Diagnosis not present

## 2023-01-14 DIAGNOSIS — M1811 Unilateral primary osteoarthritis of first carpometacarpal joint, right hand: Secondary | ICD-10-CM | POA: Diagnosis not present

## 2023-01-14 DIAGNOSIS — M1812 Unilateral primary osteoarthritis of first carpometacarpal joint, left hand: Secondary | ICD-10-CM | POA: Diagnosis not present

## 2023-01-15 DIAGNOSIS — M76821 Posterior tibial tendinitis, right leg: Secondary | ICD-10-CM | POA: Diagnosis not present

## 2023-01-20 ENCOUNTER — Ambulatory Visit
Admission: EM | Admit: 2023-01-20 | Discharge: 2023-01-20 | Disposition: A | Discharge status: Home / Self Care | Payer: Medicare Other

## 2023-01-20 DIAGNOSIS — J069 Acute upper respiratory infection, unspecified: Secondary | ICD-10-CM

## 2023-01-20 DIAGNOSIS — Z8709 Personal history of other diseases of the respiratory system: Secondary | ICD-10-CM

## 2023-01-20 MED ORDER — AMOXICILLIN-POT CLAVULANATE 875-125 MG PO TABS: 1.0000 | ORAL_TABLET | Freq: Two times a day (BID) | ORAL | 0 refills | Status: DC

## 2023-01-20 MED ORDER — CETIRIZINE HCL 10 MG PO TABS: 10.0000 mg | ORAL_TABLET | Freq: Every day | ORAL | 0 refills | Status: DC

## 2023-01-20 MED ORDER — FLUTICASONE PROPIONATE 50 MCG/ACT NA SUSP: 2.0000 | Freq: Every day | NASAL | 0 refills | Status: DC

## 2023-01-20 MED ORDER — PSEUDOEPH-BROMPHEN-DM 30-2-10 MG/5ML PO SYRP: 5.0000 mL | ORAL_SOLUTION | Freq: Four times a day (QID) | ORAL | 0 refills | Status: DC | PRN

## 2023-01-20 NOTE — ED Triage Notes (Signed)
Pt c/o cough and sinus congestion, drainage, x 5 days  and wheezing that started today. Pt reports negative home COVID test, has tried OTC medications with no relief.

## 2023-01-20 NOTE — Discharge Instructions (Addendum)
Take medication as prescribed. Increase fluids and allow for plenty of rest. Recommend Tylenol or ibuprofen as needed for pain, fever, or general discomfort. May use normal saline nasal spray throughout the day to help with nasal congestion and runny nose. For your cough, recommend use of a humidifier in your bedroom at nighttime during sleep and sleeping slightly elevated on pillows while cough symptoms persist. As discussed, if your symptoms do not improve by 01/25/23, pick up the prescription for the antibiotic at your preferred pharmacy. If symptoms fail to improve with this treatment, you may follow-up in this clinic or with your primary care physician for further evaluation. Follow-up as needed. Follow-up if your symptoms do not improve.

## 2023-01-20 NOTE — ED Provider Notes (Signed)
[facility]    CSN: [id number] Arrival date & time: 01/20/23  4098      History   Chief Complaint No chief complaint on file.   HPI Glenda White is a 67 y.o. female.   The history is provided by the patient.   Patient presents with a 5-day history of cough, nasal congestion, postnasal drainage, and wheezing that she noted this morning.  Patient denies fever, chills, headache, ear pain, shortness of breath, difficulty breathing, abdominal pain, nausea, vomiting, diarrhea, or rash.  Patient reports that she took a home COVID test which is negative.  Reports she has been trying over-the-counter medications with minimal relief.  Past Medical History:  Diagnosis Date   Allergy    Arthritis    Cataract    Chronic low back pain 09/16/2014   Hyperlipidemia    Hypertension    Migraines    Obstruction of right ureteropelvic junction (UPJ) due to stone    Rotator cuff arthropathy of right shoulder    Type 2 diabetes mellitus [facility])     Patient Active Problem List   Diagnosis Date Noted   Chronic low back pain 09/16/2014   Common migraine 09/16/2014    Past Surgical History:  Procedure Laterality Date   ABDOMINAL HYSTERECTOMY  2002   w/ Unilateral Salpingo-Oophrectomy   CESAREAN SECTION  1988   CYST REMOVAL HAND  1980s   CYSTOSCOPY/URETEROSCOPY/HOLMIUM LASER/STENT PLACEMENT Right 01/01/2017   Procedure: CYSTOSCOPY/ RETROGRADE/URETEROSCOPY/HOLMIUM LASER/STENT PLACEMENT;  Surgeon: Rene Paci, MD;  Location: [facility];  Service: Urology;  Laterality: Right;  ONLY NEEDS 30 MIN FOR PROCEDURE   ROTATOR CUFF REPAIR Left 1990s   TONSILLECTOMY  child   UMBILICAL HERNIA REPAIR  1970s & 1990s    OB History   No obstetric history on file.      Home Medications    Prior to Admission medications   Medication Sig Start Date End Date Taking? Authorizing Provider  amoxicillin-clavulanate (AUGMENTIN) 875-125 MG tablet Take 1 tablet  by mouth every 12 (twelve) hours. 01/25/23  Yes Leath-Warren, Sadie Haber, NP  brompheniramine-pseudoephedrine-DM 30-2-10 MG/5ML syrup Take 5 mLs by mouth 4 (four) times daily as needed. 01/20/23  Yes Leath-Warren, Sadie Haber, NP  cetirizine (ZYRTEC) 10 MG tablet Take 1 tablet (10 mg total) by mouth daily. 01/20/23  Yes Leath-Warren, Sadie Haber, NP  fluticasone (FLONASE) 50 MCG/ACT nasal spray Place 2 sprays into both nostrils daily. 01/20/23  Yes Leath-Warren, Sadie Haber, NP  pregabalin (LYRICA) 75 MG capsule Take 75 mg by mouth daily. 01/06/23  Yes [provider]  Turmeric (QC TUMERIC COMPLEX) 500 MG CAPS Take by mouth.   Yes [provider]  aspirin EC 81 MG tablet Take 81 mg by mouth daily.    [provider]  atorvastatin (LIPITOR) 20 MG tablet Take 20 mg by mouth every morning.     [provider]  hydrochlorothiazide (HYDRODIURIL) 25 MG tablet Take 0.5 tablets by mouth every morning.  03/16/13   [provider]  metFORMIN (GLUCOPHAGE) 500 MG tablet Take 500 mg by mouth 2 (two) times daily with a meal.     [provider]  naproxen (NAPROSYN) 500 MG tablet Take 1 tablet (500 mg total) by mouth 2 (two) times daily. Take with food 11/15/17   Triplett, Tammy, PA-C  NIFEDICAL XL 30 MG 24 hr tablet Take 1 tablet by mouth every morning.  05/13/13   [provider]  OVER THE COUNTER MEDICATION Calcium with  vitamin D, 600 mg daily.    [provider]  oxyCODONE-acetaminophen (PERCOCET/ROXICET) 5-325 MG tablet Take 1 tablet by mouth every 4 (four) hours as needed. 11/15/17   Triplett, Tammy, PA-C  pantoprazole (PROTONIX) 40 MG tablet Take 40 mg by mouth 2 (two) times daily.    [provider]  potassium chloride SA (K-DUR,KLOR-CON) 20 MEQ tablet Take 1 tablet by mouth 2 (two) times daily.  05/05/13   [provider]  topiramate (TOPAMAX) 100 MG tablet Take 1 tablet by mouth every evening.  05/05/13   [provider]    Family History Family History  Problem Relation Age of Onset   Hypertension Mother    Cancer - Lung Mother    Ovarian cancer Mother    Hypertension Sister    Diabetes Sister    Hyperlipidemia Sister    Colon cancer Neg Hx    Liver cancer Neg Hx    Esophageal cancer Neg Hx    Pancreatic cancer Neg Hx    Rectal cancer Neg Hx    Stomach cancer Neg Hx     Social History Social History   Tobacco Use   Smoking status: Never   Smokeless tobacco: Never  Vaping Use   Vaping status: Never Used  Substance Use Topics   Alcohol use: No   Drug use: No     Allergies   Shrimp [shellfish allergy]   Review of Systems Review of Systems Per HPI  Physical Exam Triage Vital Signs ED Triage Vitals  Encounter Vitals Group     BP 01/20/23 1130 (!) 147/82     Systolic BP Percentile --      Diastolic BP Percentile --      Pulse Rate 01/20/23 1130 85     Resp 01/20/23 1130 16     Temp 01/20/23 1130 98.7 F (37.1 C)     Temp Source 01/20/23 1130 Oral     SpO2 01/20/23 1130 98 %     Weight --      Height --      Head Circumference --      Peak Flow --      Pain Score 01/20/23 1133 0     Pain Loc --      Pain Education --      Exclude from Growth Chart --    No data found.  Updated Vital Signs BP (!) 147/82 (BP Location: Right Arm)   Pulse 85   Temp 98.7 F (37.1 C) (Oral)   Resp 16   SpO2 98%   Visual Acuity Right Eye Distance:   Left Eye Distance:   Bilateral Distance:    Right Eye Near:   Left Eye Near:    Bilateral Near:     Physical Exam Vitals and nursing note reviewed.  Constitutional:      General: She is not in acute distress.    Appearance: Normal appearance.  HENT:     Head: Normocephalic.     Right Ear: Tympanic membrane, ear canal and external ear normal.     Left Ear: Tympanic membrane, ear canal and external ear normal.     Nose: Congestion present.     Right Turbinates: Enlarged and swollen.     Left Turbinates: Enlarged and  swollen.     Right Sinus: No maxillary sinus tenderness or frontal sinus tenderness.     Left Sinus: No maxillary sinus tenderness or frontal sinus tenderness.     Mouth/Throat:  Mouth: Mucous membranes are moist.     Comments: Cobblestoning present to posterior oropharynx  Eyes:     Extraocular Movements: Extraocular movements intact.     Conjunctiva/sclera: Conjunctivae normal.     Pupils: Pupils are equal, round, and reactive to light.  Cardiovascular:     Rate and Rhythm: Normal rate and regular rhythm.     Pulses: Normal pulses.     Heart sounds: Normal heart sounds.  Pulmonary:     Effort: Pulmonary effort is normal. No respiratory distress.     Breath sounds: Normal breath sounds. No stridor. No wheezing, rhonchi or rales.  Abdominal:     General: Bowel sounds are normal.     Palpations: Abdomen is soft.     Tenderness: There is no abdominal tenderness.  Musculoskeletal:     Cervical back: Normal range of motion.  Lymphadenopathy:     Cervical: No cervical adenopathy.  Skin:    General: Skin is warm and dry.  Neurological:     General: No focal deficit present.     Mental Status: She is alert and oriented to person, place, and time.  Psychiatric:        Mood and Affect: Mood normal.        Behavior: Behavior normal.      UC Treatments / Results  Labs (all labs ordered are listed, but only abnormal results are displayed) Labs Reviewed - No data to display  EKG   Radiology No results found.  Procedures Procedures (including critical care time)  Medications Ordered in UC Medications - No data to display  Initial Impression / Assessment and Plan / UC Course  I have reviewed the triage vital signs and the nursing notes.  Pertinent labs & imaging results that were available during my care of the patient were reviewed by me and considered in my medical decision making (see chart for details).  Suspect a viral upper respiratory infection with cough at this  time.  Will treat symptomatically with Bromfed-DM for the cough, fluticasone 50 micro nasal spray for nasal congestion and runny nose, and cetirizine 10 mg for nasal congestion and postnasal drainage.  Patient was advised if symptoms do not improve over the next 5 days, prescription for Augmentin 875/125mg  has been sent to her preferred pharmacy.  Supportive care recommendations were provided and discussed with the patient to include over-the-counter analgesics, normal saline nasal spray, and allowing for plenty of rest.  Patient was given strict follow-up precautions, and advised she may follow-up in this clinic or with her PCP if symptoms fail to improve.  Patient was in agreement with this plan of care and verbalizes understanding.  All questions were answered.  Patient stable for discharge.  Final Clinical Impressions(s) / UC Diagnoses   Final diagnoses:  Viral upper respiratory tract infection with cough  History of allergic rhinitis     Discharge Instructions      Take medication as prescribed. Increase fluids and allow for plenty of rest. Recommend Tylenol or ibuprofen as needed for pain, fever, or general discomfort. May use normal saline nasal spray throughout the day to help with nasal congestion and runny nose. For your cough, recommend use of a humidifier in your bedroom at nighttime during sleep and sleeping slightly elevated on pillows while cough symptoms persist. As discussed, if your symptoms do not improve by 01/25/23, pick up the prescription for the antibiotic at your preferred pharmacy. If symptoms fail to improve with this treatment, you may follow-up in  this clinic or with your primary care physician for further evaluation. Follow-up as needed. Follow-up if your symptoms do not improve.      ED Prescriptions     Medication Sig Dispense Auth. Provider   brompheniramine-pseudoephedrine-DM 30-2-10 MG/5ML syrup Take 5 mLs by mouth 4 (four) times daily as needed. 140 mL  Leath-Warren, Sadie Haber, NP   cetirizine (ZYRTEC) 10 MG tablet Take 1 tablet (10 mg total) by mouth daily. 30 tablet Leath-Warren, Sadie Haber, NP   fluticasone (FLONASE) 50 MCG/ACT nasal spray Place 2 sprays into both nostrils daily. 16 g Leath-Warren, Sadie Haber, NP   amoxicillin-clavulanate (AUGMENTIN) 875-125 MG tablet Take 1 tablet by mouth every 12 (twelve) hours. 14 tablet Leath-Warren, Sadie Haber, NP      PDMP not reviewed this encounter.   Abran Cantor, NP 01/20/23 1702

## 2023-02-11 DIAGNOSIS — M180 Bilateral primary osteoarthritis of first carpometacarpal joints: Secondary | ICD-10-CM | POA: Diagnosis not present

## 2023-03-03 DIAGNOSIS — L110 Acquired keratosis follicularis: Secondary | ICD-10-CM | POA: Diagnosis not present

## 2023-03-03 DIAGNOSIS — E1140 Type 2 diabetes mellitus with diabetic neuropathy, unspecified: Secondary | ICD-10-CM | POA: Diagnosis not present

## 2023-03-03 DIAGNOSIS — M79675 Pain in left toe(s): Secondary | ICD-10-CM | POA: Diagnosis not present

## 2023-03-03 DIAGNOSIS — M79672 Pain in left foot: Secondary | ICD-10-CM | POA: Diagnosis not present

## 2023-03-03 DIAGNOSIS — M79674 Pain in right toe(s): Secondary | ICD-10-CM | POA: Diagnosis not present

## 2023-03-03 DIAGNOSIS — M79671 Pain in right foot: Secondary | ICD-10-CM | POA: Diagnosis not present

## 2023-03-06 DIAGNOSIS — Z961 Presence of intraocular lens: Secondary | ICD-10-CM | POA: Diagnosis not present

## 2023-03-06 DIAGNOSIS — H52203 Unspecified astigmatism, bilateral: Secondary | ICD-10-CM | POA: Diagnosis not present

## 2023-03-06 DIAGNOSIS — H524 Presbyopia: Secondary | ICD-10-CM | POA: Diagnosis not present

## 2023-03-06 DIAGNOSIS — E119 Type 2 diabetes mellitus without complications: Secondary | ICD-10-CM | POA: Diagnosis not present

## 2023-04-18 DIAGNOSIS — E118 Type 2 diabetes mellitus with unspecified complications: Secondary | ICD-10-CM | POA: Diagnosis not present

## 2023-04-18 DIAGNOSIS — M1990 Unspecified osteoarthritis, unspecified site: Secondary | ICD-10-CM | POA: Diagnosis not present

## 2023-04-18 DIAGNOSIS — Z79899 Other long term (current) drug therapy: Secondary | ICD-10-CM | POA: Diagnosis not present

## 2023-04-18 DIAGNOSIS — I10 Essential (primary) hypertension: Secondary | ICD-10-CM | POA: Diagnosis not present

## 2023-04-21 ENCOUNTER — Other Ambulatory Visit (HOSPITAL_COMMUNITY): Payer: Self-pay | Admitting: Internal Medicine

## 2023-04-21 DIAGNOSIS — Z1231 Encounter for screening mammogram for malignant neoplasm of breast: Secondary | ICD-10-CM

## 2023-04-24 ENCOUNTER — Encounter (HOSPITAL_COMMUNITY): Payer: Self-pay

## 2023-04-24 ENCOUNTER — Ambulatory Visit (HOSPITAL_COMMUNITY)
Admission: RE | Admit: 2023-04-24 | Discharge: 2023-04-24 | Disposition: A | Discharge status: Home / Self Care | Payer: Medicare Other | Source: Ambulatory Visit | Attending: Internal Medicine | Admitting: Internal Medicine

## 2023-04-24 DIAGNOSIS — Z1231 Encounter for screening mammogram for malignant neoplasm of breast: Secondary | ICD-10-CM | POA: Diagnosis not present

## 2023-04-25 DIAGNOSIS — E1140 Type 2 diabetes mellitus with diabetic neuropathy, unspecified: Secondary | ICD-10-CM | POA: Diagnosis not present

## 2023-04-25 DIAGNOSIS — E785 Hyperlipidemia, unspecified: Secondary | ICD-10-CM | POA: Diagnosis not present

## 2023-04-25 DIAGNOSIS — E1040 Type 1 diabetes mellitus with diabetic neuropathy, unspecified: Secondary | ICD-10-CM | POA: Diagnosis not present

## 2023-04-25 DIAGNOSIS — I10 Essential (primary) hypertension: Secondary | ICD-10-CM | POA: Diagnosis not present

## 2023-05-15 DIAGNOSIS — M79674 Pain in right toe(s): Secondary | ICD-10-CM | POA: Diagnosis not present

## 2023-05-15 DIAGNOSIS — L110 Acquired keratosis follicularis: Secondary | ICD-10-CM | POA: Diagnosis not present

## 2023-05-15 DIAGNOSIS — M79672 Pain in left foot: Secondary | ICD-10-CM | POA: Diagnosis not present

## 2023-05-15 DIAGNOSIS — M79675 Pain in left toe(s): Secondary | ICD-10-CM | POA: Diagnosis not present

## 2023-05-15 DIAGNOSIS — E1140 Type 2 diabetes mellitus with diabetic neuropathy, unspecified: Secondary | ICD-10-CM | POA: Diagnosis not present

## 2023-05-15 DIAGNOSIS — M79671 Pain in right foot: Secondary | ICD-10-CM | POA: Diagnosis not present

## 2023-06-27 ENCOUNTER — Ambulatory Visit (HOSPITAL_COMMUNITY)
Admission: RE | Admit: 2023-06-27 | Discharge: 2023-06-27 | Disposition: A | Discharge status: Home / Self Care | Source: Ambulatory Visit | Attending: Internal Medicine | Admitting: Internal Medicine

## 2023-06-27 ENCOUNTER — Other Ambulatory Visit (HOSPITAL_COMMUNITY): Payer: Self-pay | Admitting: Internal Medicine

## 2023-06-27 DIAGNOSIS — R0602 Shortness of breath: Secondary | ICD-10-CM

## 2023-06-27 DIAGNOSIS — R5383 Other fatigue: Secondary | ICD-10-CM | POA: Diagnosis not present

## 2023-06-27 DIAGNOSIS — R5382 Chronic fatigue, unspecified: Secondary | ICD-10-CM | POA: Diagnosis not present

## 2023-06-27 DIAGNOSIS — R0609 Other forms of dyspnea: Secondary | ICD-10-CM | POA: Diagnosis not present

## 2023-07-03 DIAGNOSIS — N200 Calculus of kidney: Secondary | ICD-10-CM | POA: Diagnosis not present

## 2023-07-03 DIAGNOSIS — N3281 Overactive bladder: Secondary | ICD-10-CM | POA: Diagnosis not present

## 2023-07-24 DIAGNOSIS — L110 Acquired keratosis follicularis: Secondary | ICD-10-CM | POA: Diagnosis not present

## 2023-07-24 DIAGNOSIS — M79671 Pain in right foot: Secondary | ICD-10-CM | POA: Diagnosis not present

## 2023-07-24 DIAGNOSIS — M79672 Pain in left foot: Secondary | ICD-10-CM | POA: Diagnosis not present

## 2023-07-24 DIAGNOSIS — E1140 Type 2 diabetes mellitus with diabetic neuropathy, unspecified: Secondary | ICD-10-CM | POA: Diagnosis not present

## 2023-07-24 DIAGNOSIS — M79674 Pain in right toe(s): Secondary | ICD-10-CM | POA: Diagnosis not present

## 2023-07-24 DIAGNOSIS — M79675 Pain in left toe(s): Secondary | ICD-10-CM | POA: Diagnosis not present

## 2023-08-18 DIAGNOSIS — E1140 Type 2 diabetes mellitus with diabetic neuropathy, unspecified: Secondary | ICD-10-CM | POA: Diagnosis not present

## 2023-08-25 DIAGNOSIS — E1140 Type 2 diabetes mellitus with diabetic neuropathy, unspecified: Secondary | ICD-10-CM | POA: Diagnosis not present

## 2023-08-25 DIAGNOSIS — E1149 Type 2 diabetes mellitus with other diabetic neurological complication: Secondary | ICD-10-CM | POA: Diagnosis not present

## 2023-08-25 DIAGNOSIS — R0602 Shortness of breath: Secondary | ICD-10-CM | POA: Diagnosis not present

## 2023-09-04 DIAGNOSIS — G4730 Sleep apnea, unspecified: Secondary | ICD-10-CM | POA: Diagnosis not present

## 2023-09-16 ENCOUNTER — Encounter: Payer: Self-pay | Admitting: Internal Medicine

## 2023-09-16 ENCOUNTER — Telehealth: Payer: Self-pay | Admitting: Internal Medicine

## 2023-09-16 ENCOUNTER — Ambulatory Visit: Attending: Internal Medicine | Admitting: Internal Medicine

## 2023-09-16 VITALS — BP 128/80 | HR 83 | Ht 60.0 in | Wt 220.2 lb

## 2023-09-16 DIAGNOSIS — E785 Hyperlipidemia, unspecified: Secondary | ICD-10-CM | POA: Insufficient documentation

## 2023-09-16 DIAGNOSIS — E7849 Other hyperlipidemia: Secondary | ICD-10-CM | POA: Diagnosis not present

## 2023-09-16 DIAGNOSIS — R0602 Shortness of breath: Secondary | ICD-10-CM | POA: Diagnosis not present

## 2023-09-16 DIAGNOSIS — R0609 Other forms of dyspnea: Secondary | ICD-10-CM | POA: Diagnosis not present

## 2023-09-16 DIAGNOSIS — I10 Essential (primary) hypertension: Secondary | ICD-10-CM | POA: Diagnosis not present

## 2023-09-16 NOTE — Patient Instructions (Addendum)
 Medication Instructions:  Your physician recommends that you continue on your current medications as directed. Please refer to the Current Medication list given to you today.   Labwork: None  Testing/Procedures: Your physician has requested that you have an echocardiogram. Echocardiography is a painless test that uses sound waves to create images of your heart. It provides your doctor with information about the size and shape of your heart and how well your heart's chambers and valves are working. This procedure takes approximately one hour. There are no restrictions for this procedure. Please do NOT wear cologne, perfume, aftershave, or lotions (deodorant is allowed). Please arrive 15 minutes prior to your appointment time.  Please note: We ask at that you not bring children with you during ultrasound (echo/ vascular) testing. Due to room size and safety concerns, children are not allowed in the ultrasound rooms during exams. Our front office staff cannot provide observation of children in our lobby area while testing is being conducted. An adult accompanying a patient to their appointment will only be allowed in the ultrasound room at the discretion of the ultrasound technician under special circumstances. We apologize for any inconvenience.  Your physician has requested that you have en exercise stress myoview. For further information please visit [email or web address] Please follow instruction sheet, as given.   Follow-Up: Your physician recommends that you schedule a follow-up appointment in: 6 months  Any Other Special Instructions Will Be Listed Below (If Applicable). Thank you for choosing [facility]!     If you need a refill on your cardiac medications before your next appointment, please call your pharmacy.

## 2023-09-16 NOTE — Progress Notes (Signed)
 Cardiology Office Note  Date: 09/16/2023   ID: Glenda, White [birth year], MRN [id number]  PCP:  Sheryle Carwin, MD  Cardiologist:  Diannah SHAUNNA Maywood, MD Electrophysiologist:  None   History of Present Illness: Glenda White is a 68 y.o. female  Referred to cardiology neck for evaluation of DOE.  Ongoing DOE for the last 6 months.  Occurs with exertional activities like walking, any physical activity.  No angina.  No dizziness, palpitations, syncope, leg swelling.   Past Medical History:  Diagnosis Date   Allergy    Arthritis    Cataract    Chronic low back pain 09/16/2014   Hyperlipidemia    Hypertension    Migraines    Obstruction of right ureteropelvic junction (UPJ) due to stone    Rotator cuff arthropathy of right shoulder    Type 2 diabetes mellitus (HCC)     Past Surgical History:  Procedure Laterality Date   ABDOMINAL HYSTERECTOMY  2002   w/ Unilateral Salpingo-Oophrectomy   CESAREAN SECTION  1988   CYST REMOVAL HAND  1980s   CYSTOSCOPY/URETEROSCOPY/HOLMIUM LASER/STENT PLACEMENT Right 01/01/2017   Procedure: CYSTOSCOPY/ RETROGRADE/URETEROSCOPY/HOLMIUM LASER/STENT PLACEMENT;  Surgeon: Devere Lonni Righter, MD;  Location: [facility];  Service: Urology;  Laterality: Right;  ONLY NEEDS 30 MIN FOR PROCEDURE   ROTATOR CUFF REPAIR Left 1990s   TONSILLECTOMY  child   UMBILICAL HERNIA REPAIR  1970s & 1990s    Current Outpatient Medications  Medication Sig Dispense Refill   atorvastatin (LIPITOR) 20 MG tablet Take 20 mg by mouth every morning.      diclofenac Sodium (VOLTAREN) 1 % GEL as needed.     ferrous sulfate 325 (65 FE) MG EC tablet Take 325 mg by mouth once a week.     hydrochlorothiazide (HYDRODIURIL) 25 MG tablet Take 0.5 tablets by mouth every morning.      metFORMIN (GLUCOPHAGE) 500 MG tablet Take 500 mg by mouth 2 (two) times daily with a meal.      NIFEDICAL XL 30 MG 24 hr tablet Take 1 tablet by mouth every  morning.      potassium chloride  SA (K-DUR,KLOR-CON ) 20 MEQ tablet Take 2 tabs by mouth every morning & 1 tab every evening     pregabalin (LYRICA) 75 MG capsule Take 75 mg by mouth 2 (two) times daily.     topiramate (TOPAMAX) 100 MG tablet Take 1 tablet by mouth every evening.      Turmeric (QC TUMERIC COMPLEX) 500 MG CAPS Take by mouth.     aspirin EC 81 MG tablet Take 81 mg by mouth daily. (Patient not taking: Reported on 09/16/2023)     Current Facility-Administered Medications  Medication Dose Route Frequency Provider Last Rate Last Admin   0.9 %  sodium chloride  infusion  500 mL Intravenous Once Aneita Gwendlyn DASEN, MD       Allergies:  Shrimp [shellfish allergy]   Social History: The patient  reports that she has never smoked. She has never used smokeless tobacco. She reports that she does not drink alcohol and does not use drugs.   Family History: The patient's family history includes Cancer - Lung in her mother; Diabetes in her sister; Hyperlipidemia in her sister; Hypertension in her mother and sister; Ovarian cancer in her mother.   ROS:  Please see the history of present illness. Otherwise, complete review of systems is positive for none  All other systems are reviewed and negative.   Physical Exam:  VS:  BP 128/80   Pulse 83   Ht 5' (1.524 m)   Wt 220 lb 3.2 oz (99.9 kg)   SpO2 96%   BMI 43.00 kg/m , BMI Body mass index is 43 kg/m.  Wt Readings from Last 3 Encounters:  09/16/23 220 lb 3.2 oz (99.9 kg)  12/10/17 198 lb (89.8 kg)  11/15/17 198 lb (89.8 kg)    General: Patient appears comfortable at rest. HEENT: Conjunctiva and lids normal, oropharynx clear with moist mucosa. Neck: Supple, no elevated JVP or carotid bruits, no thyromegaly. Lungs: Clear to auscultation, nonlabored breathing at rest. Cardiac: Regular rate and rhythm, no S3 or significant systolic murmur, no pericardial rub. Abdomen: Soft, nontender, no hepatomegaly, bowel sounds present, no guarding or  rebound. Extremities: No pitting edema, distal pulses 2+. Skin: Warm and dry. Musculoskeletal: No kyphosis. Neuropsychiatric: Alert and oriented x3, affect grossly appropriate.  Recent Labwork: No results found for requested labs within last 365 days.  No results found for: CHOL, TRIG, HDL, CHOLHDL, VLDL, LDLCALC, LDLDIRECT  Other Studies Reviewed Today:   Assessment and Plan:  DOE: Ongoing DOE for the last 6 months despite losing weight.  No angina.  Cardiac risk factors include HTN, DM 2.  Obtain echocardiogram and exercise Myoview.  HTN, controlled: Continue HCTZ 12.5 mg once daily, nifedipine 30 mg once daily.  Follows with PCP.  HLD, known values: Continue atorvastatin 20 mg at bedtime.  Goal LDL <100.       Medication Adjustments/Labs and Tests Ordered: Current medicines are reviewed at length with the patient today.  Concerns regarding medicines are outlined above.    Disposition:  Follow up 6 months  Signed Rhonna Holster Priya Sajan [place]atwood, MD, 09/16/2023 3:26 PM    [facility] at [facility] [address] [place], [place], [place] [address]

## 2023-09-16 NOTE — Telephone Encounter (Signed)
 Checking percert on the following patient for testing scheduled at [facility].    Exercise Myoview    09/24/2023

## 2023-09-24 ENCOUNTER — Ambulatory Visit (HOSPITAL_COMMUNITY)
Admission: RE | Admit: 2023-09-24 | Discharge: 2023-09-24 | Disposition: A | Discharge status: Home / Self Care | Source: Ambulatory Visit | Attending: Internal Medicine | Admitting: Internal Medicine

## 2023-09-24 ENCOUNTER — Encounter (HOSPITAL_BASED_OUTPATIENT_CLINIC_OR_DEPARTMENT_OTHER)
Admission: RE | Admit: 2023-09-24 | Discharge: 2023-09-24 | Disposition: A | Discharge status: Home / Self Care | Source: Ambulatory Visit | Attending: Internal Medicine

## 2023-09-24 ENCOUNTER — Other Ambulatory Visit: Payer: Self-pay | Admitting: Student

## 2023-09-24 DIAGNOSIS — R0609 Other forms of dyspnea: Secondary | ICD-10-CM | POA: Insufficient documentation

## 2023-09-24 DIAGNOSIS — R0602 Shortness of breath: Secondary | ICD-10-CM | POA: Insufficient documentation

## 2023-09-24 LAB — NM MYOCAR MULTI W/SPECT W/WALL MOTION / EF
Angina Index: 0
Duke Treadmill Score: 2
Estimated workload: 4.6
Exercise duration (min): 2 min
Exercise duration (sec): 28 s
LV dias vol: 63 mL (ref 46–106)
LV sys vol: 23 mL (ref 3.8–5.2)
MPHR: 152 {beats}/min
Nuc Stress EF: 64 %
Peak HR: 139 {beats}/min
Percent HR: 91 %
RATE: 0.6
RPE: 12
Rest HR: 71 {beats}/min
Rest Nuclear Isotope Dose: 10.8 mCi
SDS: 1
SRS: 3
SSS: 4
ST Depression (mm): 0 mm
Stress Nuclear Isotope Dose: 30.3 mCi
TID: 1.13

## 2023-09-24 MED ORDER — SODIUM CHLORIDE FLUSH 0.9 % IV SOLN
INTRAVENOUS | Status: AC
  Administered 2023-09-24: 10 mL via INTRAVENOUS
  Filled 2023-09-24: qty 10

## 2023-09-24 MED ORDER — REGADENOSON 0.4 MG/5ML IV SOLN
INTRAVENOUS | Status: AC
  Filled 2023-09-24: qty 5

## 2023-09-24 MED ORDER — TECHNETIUM TC 99M TETROFOSMIN IV KIT
30.0000 | PACK | Freq: Once | INTRAVENOUS | Status: AC | PRN
  Administered 2023-09-24: 30.3 via INTRAVENOUS

## 2023-09-24 MED ORDER — TECHNETIUM TC 99M TETROFOSMIN IV KIT
10.0000 | PACK | Freq: Once | INTRAVENOUS | Status: AC | PRN
  Administered 2023-09-24: 10.8 via INTRAVENOUS

## 2023-09-24 NOTE — Progress Notes (Signed)
     Glenda White presented for a Treadmill nuclear stress test today.  I Laymon CHRISTELLA Qua, PA-C, provided direct supervision and was present during the stress portion of the study today, which was completed without significant symptoms, immediate complications, or acute ST/T changes on ECG.  Stress imaging is pending at this time.  Preliminary ECG findings may be listed in the chart, but the stress test result will not be finalized until perfusion imaging is complete.  Laymon CHRISTELLA Qua, PA-C  09/24/2023, 10:13 AM

## 2023-10-01 ENCOUNTER — Ambulatory Visit: Payer: Self-pay | Admitting: Internal Medicine

## 2023-10-07 ENCOUNTER — Ambulatory Visit: Attending: Internal Medicine

## 2023-10-07 DIAGNOSIS — R0609 Other forms of dyspnea: Secondary | ICD-10-CM | POA: Diagnosis not present

## 2023-10-07 DIAGNOSIS — R0602 Shortness of breath: Secondary | ICD-10-CM

## 2023-10-07 LAB — ECHOCARDIOGRAM COMPLETE
AR max vel: 1.96 cm2
AV Area VTI: 2.25 cm2
AV Area mean vel: 2.04 cm2
AV Mean grad: 3 mmHg
AV Peak grad: 5.5 mmHg
Ao pk vel: 1.17 m/s
Area-P 1/2: 3.3 cm2
Calc EF: 55.8 %
MV VTI: 3.93 cm2
S' Lateral: 3.1 cm
Single Plane A2C EF: 52.9 %
Single Plane A4C EF: 58.1 %

## 2023-10-15 DIAGNOSIS — M79675 Pain in left toe(s): Secondary | ICD-10-CM | POA: Diagnosis not present

## 2023-10-15 DIAGNOSIS — M79674 Pain in right toe(s): Secondary | ICD-10-CM | POA: Diagnosis not present

## 2023-10-15 DIAGNOSIS — E1140 Type 2 diabetes mellitus with diabetic neuropathy, unspecified: Secondary | ICD-10-CM | POA: Diagnosis not present

## 2023-10-15 DIAGNOSIS — L110 Acquired keratosis follicularis: Secondary | ICD-10-CM | POA: Diagnosis not present

## 2023-10-15 DIAGNOSIS — M79672 Pain in left foot: Secondary | ICD-10-CM | POA: Diagnosis not present

## 2023-10-15 DIAGNOSIS — M79671 Pain in right foot: Secondary | ICD-10-CM | POA: Diagnosis not present

## 2023-10-20 ENCOUNTER — Encounter (HOSPITAL_BASED_OUTPATIENT_CLINIC_OR_DEPARTMENT_OTHER): Payer: Self-pay | Admitting: Primary Care

## 2023-10-20 ENCOUNTER — Ambulatory Visit (HOSPITAL_BASED_OUTPATIENT_CLINIC_OR_DEPARTMENT_OTHER): Admitting: Primary Care

## 2023-10-20 VITALS — BP 142/77 | HR 68 | Ht 60.0 in | Wt 217.7 lb

## 2023-10-20 DIAGNOSIS — G4733 Obstructive sleep apnea (adult) (pediatric): Secondary | ICD-10-CM | POA: Diagnosis not present

## 2023-10-20 DIAGNOSIS — E669 Obesity, unspecified: Secondary | ICD-10-CM | POA: Diagnosis not present

## 2023-10-20 DIAGNOSIS — R0602 Shortness of breath: Secondary | ICD-10-CM

## 2023-10-20 DIAGNOSIS — G4700 Insomnia, unspecified: Secondary | ICD-10-CM

## 2023-10-20 DIAGNOSIS — E119 Type 2 diabetes mellitus without complications: Secondary | ICD-10-CM

## 2023-10-20 DIAGNOSIS — Z6841 Body Mass Index (BMI) 40.0 and over, adult: Secondary | ICD-10-CM

## 2023-10-20 HISTORY — DX: Type 2 diabetes mellitus without complications: E11.9

## 2023-10-20 NOTE — Progress Notes (Signed)
 @Patient  ID: Glenda White, female    DOB: 1956/03/18, 68 y.o.   MRN: [id number]  Chief Complaint  Patient presents with   Establish Care    Sleep    Referring provider: Sheryle Carwin, MD  HPI: 68 year old female, never smoked. PMH significant for HTN, type 2 diabetes.   10/20/2023 Discussed the use of AI scribe software for clinical note transcription with the patient, who gave verbal consent to proceed.  History of Present Illness Glenda White is a 68 year old female with type 2 diabetes who presents with sleep disruption and suspected mild sleep apnea. She was referred by Dr. Sheryle for evaluation of suspected mild sleep apnea.  She experiences sleep disruption, characterized by waking up three to four hours after falling asleep and having difficulty returning to sleep. She typically goes to bed between 11 PM and 1 AM and wakes up around 3 or 4 AM. Despite these disruptions, she does not feel excessively tired during the day and does not regularly take naps, although she occasionally takes short 'power naps' of 10 to 15 minutes. Her husband notes that she snores at night.  A home sleep study conducted from June 16th to 18th was performed; the patient recalls being told by Dr. Sheryle that her oxygen level dropped about 14 or 15 times per hour, and that it might be mild sleep apnea. The actual report showed between 8 and 14 apneas per hour, with an average oxygen saturation of 99% and a minimum of 87%, and she spent less than half a minute with low oxygen levels.   She experiences shortness of breath, particularly when walking or sometimes while sitting. She has undergone an echocardiogram and a stress test, but the echocardiogram was not adequate for assessing pulmonary artery pressure.  Her current medications include metformin, which she takes twice daily for type 2 diabetes. She denies smoking and reports sleeping on her left side consistently for 37 years. She has  experienced weight fluctuations, noting a recent weight of 217 pounds, down from 221 pounds the previous week.   Allergies  Allergen Reactions   Shrimp [Shellfish Allergy] Other (See Comments)    Causes gout     There is no immunization history on file for this patient.  Past Medical History:  Diagnosis Date   Allergy    Arthritis    Cataract    Chronic low back pain 09/16/2014   Hyperlipidemia    Hypertension    Migraines    Obstruction of right ureteropelvic junction (UPJ) due to stone    Rotator cuff arthropathy of right shoulder    Type 2 diabetes mellitus (HCC)     Tobacco History: Social History   Tobacco Use  Smoking Status Never  Smokeless Tobacco Never   Counseling given: Not Answered   Outpatient Medications Prior to Visit  Medication Sig Dispense Refill   atorvastatin (LIPITOR) 20 MG tablet Take 20 mg by mouth every morning.      diclofenac Sodium (VOLTAREN) 1 % GEL as needed.     econazole nitrate 1 % cream Apply 1 Application topically 2 (two) times daily.     ferrous sulfate 325 (65 FE) MG EC tablet Take 325 mg by mouth once a week.     hydrochlorothiazide (HYDRODIURIL) 25 MG tablet Take 0.5 tablets by mouth every morning.      metFORMIN (GLUCOPHAGE) 500 MG tablet Take 500 mg by mouth 2 (two) times daily with a meal.  NIFEDICAL XL 30 MG 24 hr tablet Take 1 tablet by mouth every morning.      potassium chloride  SA (K-DUR,KLOR-CON ) 20 MEQ tablet Take 2 tabs by mouth every morning & 1 tab every evening     pregabalin (LYRICA) 75 MG capsule Take 75 mg by mouth 2 (two) times daily.     topiramate (TOPAMAX) 100 MG tablet Take 1 tablet by mouth every evening.      Turmeric (QC TUMERIC COMPLEX) 500 MG CAPS Take by mouth.     aspirin EC 81 MG tablet Take 81 mg by mouth daily. (Patient not taking: Reported on 10/20/2023)     Facility-Administered Medications Prior to Visit  Medication Dose Route Frequency Provider Last Rate Last Admin   0.9 %  sodium chloride   infusion  500 mL Intravenous Once Aneita Gwendlyn DASEN, MD          Review of Systems  Review of Systems  Constitutional:  Positive for fatigue.  HENT: Negative.    Respiratory:  Positive for shortness of breath.   Psychiatric/Behavioral:  Positive for sleep disturbance.      Physical Exam  BP (!) 142/77   Pulse 68   Ht 5' (1.524 m)   Wt 217 lb 11.2 oz (98.7 kg)   SpO2 100%   BMI 42.52 kg/m  Physical Exam Constitutional:      Appearance: Normal appearance. She is well-developed. She is obese.  HENT:     Head: Normocephalic and atraumatic.     Mouth/Throat:     Mouth: Mucous membranes are moist.     Pharynx: Oropharynx is clear.  Eyes:     Pupils: Pupils are equal, round, and reactive to light.  Cardiovascular:     Rate and Rhythm: Normal rate and regular rhythm.     Heart sounds: Normal heart sounds. No murmur heard. Pulmonary:     Effort: Pulmonary effort is normal. No respiratory distress.     Breath sounds: Normal breath sounds. No wheezing or rhonchi.  Abdominal:     General: Bowel sounds are normal.     Palpations: Abdomen is soft.     Tenderness: There is no abdominal tenderness.  Musculoskeletal:        General: Normal range of motion.     Cervical back: Normal range of motion and neck supple.  Skin:    General: Skin is warm and dry.     Findings: No erythema or rash.  Neurological:     General: No focal deficit present.     Mental Status: She is alert and oriented to person, place, and time. Mental status is at baseline.  Psychiatric:        Mood and Affect: Mood normal.        Behavior: Behavior normal.        Thought Content: Thought content normal.        Judgment: Judgment normal.       Lab Results:  CBC    Component Value Date/Time   WBC 8.8 06/24/2017 2203   RBC 4.27 06/24/2017 2203   HGB 12.6 06/24/2017 2203   HCT 38.8 06/24/2017 2203   PLT 215 06/24/2017 2203   MCV 90.9 06/24/2017 2203   MCH 29.5 06/24/2017 2203   MCHC 32.5  06/24/2017 2203   RDW 14.7 06/24/2017 2203   LYMPHSABS 2.2 06/24/2017 2203   MONOABS 0.7 06/24/2017 2203   EOSABS 0.1 06/24/2017 2203   BASOSABS 0.0 06/24/2017 2203    BMET  Component Value Date/Time   NA 142 06/24/2017 2203   K 3.4 (L) 06/24/2017 2203   CL 107 06/24/2017 2203   CO2 23 06/24/2017 2203   GLUCOSE 80 06/24/2017 2203   BUN 12 06/24/2017 2203   CREATININE 0.81 06/24/2017 2203   CALCIUM 9.6 06/24/2017 2203   GFRNONAA >60 06/24/2017 2203   GFRAA >60 06/24/2017 2203    BNP No results found for: BNP  ProBNP No results found for: PROBNP  Imaging: ECHOCARDIOGRAM COMPLETE Result Date: 10/07/2023    ECHOCARDIOGRAM REPORT   Patient Name:   JREAM BROYLES Arth Date of Exam: 10/07/2023 Medical Rec #:  [id number]              Height:       60.0 in Accession #:    [id number]             Weight:       220.2 lb Date of Birth:  Dec 23, 1955              BSA:          1.945 m Patient Age:    68 years               BP:           128/80 mmHg Patient Gender: F                      HR:           71 bpm. Exam Location:  [facility] Procedure: 2D Echo, Cardiac Doppler and Color Doppler (Both Spectral and Color            Flow Doppler were utilized during procedure). Indications:    R06.9 DOE  History:        Patient has no prior history of Echocardiogram examinations.                 Signs/Symptoms:Dyspnea; Risk Factors:Morbid obesity,                 Hypertension, Dyslipidemia and Non-Smoker.  Sonographer:    Bascom Burows RCS, RVS Referring Phys: [id number] VISHNU P MALLIPEDDI  Sonographer Comments: Global longitudinal strain was attempted. IMPRESSIONS  1. Left ventricular ejection fraction, by estimation, is 55 to 60%. The left ventricle has normal function. The left ventricle has no regional wall motion abnormalities. Left ventricular diastolic parameters are indeterminate.  2. Right ventricular systolic function is normal. The right ventricular size is normal. Tricuspid regurgitation signal is  inadequate for assessing PA pressure.  3. A small organized pericardial effusion is present versus prominent fat pad. The pericardial effusion is circumferential.  4. The mitral valve is grossly normal. Trivial mitral valve regurgitation.  5. The aortic valve is tricuspid. Aortic valve regurgitation is not visualized. No aortic stenosis is present. Aortic valve mean gradient measures 3.0 mmHg.  6. The inferior vena cava is normal in size with greater than 50% respiratory variability, suggesting right atrial pressure of 3 mmHg. Comparison(s): No prior Echocardiogram. FINDINGS  Left Ventricle: Left ventricular ejection fraction, by estimation, is 55 to 60%. The left ventricle has normal function. The left ventricle has no regional wall motion abnormalities. The left ventricular internal cavity size was normal in size. There is  borderline concentric left ventricular hypertrophy. Left ventricular diastolic parameters are indeterminate. Right Ventricle: The right ventricular size is normal. No increase in right ventricular wall thickness. Right ventricular systolic function is normal. Tricuspid regurgitation signal is inadequate for assessing  PA pressure. Left Atrium: Left atrial size was normal in size. Right Atrium: Right atrial size was normal in size. Pericardium: A small pericardial effusion is present. The pericardial effusion is circumferential. Presence of epicardial fat layer. Mitral Valve: The mitral valve is grossly normal. Trivial mitral valve regurgitation. MV peak gradient, 2.4 mmHg. The mean mitral valve gradient is 1.0 mmHg. Tricuspid Valve: The tricuspid valve is grossly normal. Tricuspid valve regurgitation is trivial. Aortic Valve: The aortic valve is tricuspid. Aortic valve regurgitation is not visualized. No aortic stenosis is present. Aortic valve mean gradient measures 3.0 mmHg. Aortic valve peak gradient measures 5.5 mmHg. Aortic valve area, by VTI measures 2.25 cm. Pulmonic Valve: The pulmonic  valve was grossly normal. Pulmonic valve regurgitation is trivial. Aorta: The aortic root and ascending aorta are structurally normal, with no evidence of dilitation. Venous: The inferior vena cava is normal in size with greater than 50% respiratory variability, suggesting right atrial pressure of 3 mmHg. IAS/Shunts: No atrial level shunt detected by color flow Doppler. Additional Comments: 3D was performed not requiring image post processing on an independent workstation and was indeterminate.  LEFT VENTRICLE PLAX 2D LVIDd:         4.60 cm     Diastology LVIDs:         3.10 cm     LV e' medial:    7.51 cm/s LV PW:         1.00 cm     LV E/e' medial:  8.9 LV IVS:        1.00 cm     LV e' lateral:   5.98 cm/s LVOT diam:     2.00 cm     LV E/e' lateral: 11.2 LV SV:         61 LV SV Index:   31 LVOT Area:     3.14 cm  LV Volumes (MOD) LV vol d, MOD A2C: 47.1 ml LV vol d, MOD A4C: 63.5 ml LV vol s, MOD A2C: 22.2 ml LV vol s, MOD A4C: 26.6 ml LV SV MOD A2C:     24.9 ml LV SV MOD A4C:     63.5 ml LV SV MOD BP:      31.0 ml RIGHT VENTRICLE             IVC RV Basal diam:  2.80 cm     IVC diam: 1.60 cm RV Mid diam:    2.20 cm RV S prime:     11.30 cm/s TAPSE (M-mode): 1.9 cm LEFT ATRIUM             Index        RIGHT ATRIUM           Index LA diam:        3.40 cm 1.75 cm/m   RA Area:     13.60 cm LA Vol (A2C):   41.0 ml 21.08 ml/m  RA Volume:   32.60 ml  16.77 ml/m LA Vol (A4C):   19.0 ml 9.77 ml/m LA Biplane Vol: 30.9 ml 15.89 ml/m  AORTIC VALVE                    PULMONIC VALVE AV Area (Vmax):    1.96 cm     PV Vmax:          0.79 m/s AV Area (Vmean):   2.04 cm     PV Peak grad:     2.5 mmHg AV Area (VTI):  2.25 cm     PR End Diast Vel: 3.17 msec AV Vmax:           117.00 cm/s AV Vmean:          84.800 cm/s AV VTI:            0.271 m AV Peak Grad:      5.5 mmHg AV Mean Grad:      3.0 mmHg LVOT Vmax:         73.10 cm/s LVOT Vmean:        55.100 cm/s LVOT VTI:          0.194 m LVOT/AV VTI ratio: 0.72  AORTA Ao  Root diam: 2.80 cm Ao Asc diam:  3.40 cm MITRAL VALVE MV Area (PHT): 3.30 cm    SHUNTS MV Area VTI:   3.93 cm    Systemic VTI:  0.19 m MV Peak grad:  2.4 mmHg    Systemic Diam: 2.00 cm MV Mean grad:  1.0 mmHg MV Vmax:       0.78 m/s MV Vmean:      48.6 cm/s MV Decel Time: 230 msec MV E velocity: 66.80 cm/s MV A velocity: 87.40 cm/s MV E/A ratio:  0.76 Jayson Sierras MD Electronically signed by Jayson Sierras MD Signature Date/Time: 10/07/2023/11:54:20 AM    Final    NM Myocar Multi W/Spect Marisela Motion / EF Result Date: 09/24/2023   Patient exercised for 2 minutes 28 seconds, per Bruce protocol, achieving 4.60 METS. Elevated BP response and below average exercise capacity. No angina during the test. Test was stopped due to DOE.   No ST deviation was noted.  Artifact at times despite lead adjustment but no definitive changes.   LV perfusion is normal. There is no evidence of ischemia. There is no evidence of infarction.   Left ventricular function is normal. Nuclear stress EF: 64%.   Findings are consistent with no ischemia and no infarction. The study is low risk.     Assessment & Plan:   1. OSA (obstructive sleep apnea) (Primary) - AMB REFERRAL FOR DME  Assessment and Plan Assessment & Plan Obstructive sleep apnea, mild Patient had a HST on 09/04/23 with PCP which showed mild obstructive sleep apnea with 8 apneas per hour. Oxygen levels average 99% with a minimum of 87%. Symptoms include snoring and sleep disruption. Risk for comorbidities such as cardiac arrhythmias, stroke, pulmonary hypertension, and diabetes is minimal. - Discuss CPAP and oral appliance therapy. CPAP is covered by insurance and may improve sleep quality and reduce snoring. Oral appliance is not covered. CPAP requires maintenance and compliance (4 hours/night, 70% of the time). - Order auto CPAP 5-15cm h20 with nasal mask. - Arrange compliance check within 90 days.  Insomnia Insomnia characterized by difficulty staying  asleep, waking after 3-4 hours, and trouble falling back asleep. She avoids naps to prevent further disruption. Weight loss and avoiding alcohol at night may improve sleep quality. She prefers not to take medication. - Advise on sleep hygiene: avoid alcohol at night, consider later bedtime. - Encourage weight loss to improve sleep quality.  Dyspnea on exertion Dyspnea on exertion and occasionally when sitting down. Echocardiogram was inadequate for assessing pulmonary artery pressure. Mild sleep apnea may contribute to dyspnea by increasing pulmonary artery pressure. Weight loss and exercise may alleviate symptoms. - Encourage weight loss. - Recommend exercise: 20-30 minutes, 2-3 times a week. - Consider trial of CPAP for 3-6 months to assess impact on dyspnea.  Obesity  Obesity with recent weight gain. Current weight is 217 pounds. Weight loss could improve mild sleep apnea and dyspnea. Maintaining or losing weight can prevent worsening of sleep apnea. - Encourage weight loss to below 200 pounds. - Advise on diet and exercise.     Almarie LELON Ferrari, NP 10/20/2023

## 2023-10-20 NOTE — Progress Notes (Signed)
 Epworth Sleepiness Scale  Use the following scale to choose the most appropriate number for each situation. 0 Would never nod off 1  Slight  chance of nodding off 2 Moderate chance of nodding off 3 High chance of nodding off  Sitting and reading: 1 Watching TV: 2 Sitting, inactive, in a public place (e.g., in a meeting, theater, or dinner event): 0 As a passenger in a car for an hour or more without stopping for a break: 0 Lying down to rest when circumstances permit:1 Sitting and talking to someone: 0 Sitting quietly after a meal without alcohol: 1 In a car, while stopped for a few  minutes in traffic or at a light: 0  TOTOAL: 5

## 2023-10-20 NOTE — Patient Instructions (Addendum)
 VISIT SUMMARY: Today, we discussed your sleep disruption and suspected mild sleep apnea. We reviewed your home sleep study results, which showed mild obstructive sleep apnea. We also talked about your difficulty staying asleep, shortness of breath, and recent weight changes.  YOUR PLAN: -OBSTRUCTIVE SLEEP APNEA, MILD: Mild obstructive sleep apnea means you have brief periods during sleep when your breathing stops or becomes very shallow. We discussed using a CPAP machine, which can help improve your sleep quality and reduce snoring. We will order a CPAP with a nasal mask for you and check your compliance within 90 days.  -INSOMNIA: Insomnia is when you have trouble staying asleep or falling back asleep after waking up. We recommend practicing good sleep habits, such as avoiding alcohol at night and considering a later bedtime. Weight loss may also help improve your sleep quality.  -DYSPNEA ON EXERTION: Dyspnea on exertion means you experience shortness of breath when you are active or sometimes even when sitting. This may be related to your mild sleep apnea. We suggest weight loss and regular exercise to help alleviate these symptoms. We will also see if using the CPAP machine helps improve your breathing over the next 3-6 months.  -OBESITY: Obesity means having excess body weight. Losing weight can help improve your mild sleep apnea and shortness of breath. We encourage you to aim for a weight below 200 pounds and will provide advice on diet and exercise.  INSTRUCTIONS: We will order a CPAP machine with a nasal mask for you. Please use it as directed, and we will check your compliance within 90 days. Additionally, we recommend regular exercise (20-30 minutes, 2-3 times a week) and weight loss to help improve your symptoms. Follow good sleep hygiene practices, such as avoiding alcohol at night and considering a later bedtime.  Follow-up 6-8 weeks for CPAP compliance with Beth NP  CPAP and BIPAP  Information CPAP and BIPAP use air pressure to keep your airways open and help you breathe well. CPAP and BIPAP use different amounts of pressure. Your health care provider will tell you whether CPAP or BIPAP would be best for you. CPAP stands for continuous positive airway pressure. With CPAP, the amount of pressure stays the same while you breathe in and out. BIPAP stands for bi-level positive airway pressure. With BIPAP, the amount of pressure will be higher when you breathe in and lower when you breathe out. This allows you to take bigger breaths. CPAP or BIPAP may be used in the hospital or at home. You may need to have a sleep study before your provider can order a device for you to use at home. What are the advantages? CPAP and BIPAP are most often used for obstructive sleep apnea to keep the airways from collapsing when the muscles relax during sleep. CPAP or BIPAP can be used if you have: Chronic obstructive pulmonary disease. Heart failure. Medical conditions that cause muscle weakness. Other problems that cause breathing to be shallow, weak, or difficult. What are the risks? Your provider will talk with you about risks. These may include: Sores on your nose or face caused from the mask, prongs, or nasal pillows. Dry or stuffy nose or nosebleeds. Feeling gassy or bloated. Sinus or lung infection if the equipment is not cleaned well. When should CPAP or BIPAP be used? In most cases, CPAP or BIPAP is used during sleep at night or whenever the main sleep time happens. It's also used during naps. People with some medical conditions may need  to wear the mask when they're awake. Follow instructions from your provider about when to use your CPAP or BIPAP. What happens during CPAP or BIPAP?  Both CPAP and BIPAP use a small machine that uses electricity to create air pressure. A long tube connects the device to a plastic mask. Air is blown through the mask into your nose or mouth. The amount  of pressure that's used to blow the air can be adjusted. Your provider will set the pressure setting and help you find the best mask for you. Tips for using the mask There are different types and sizes of masks. If your mask does not fit well, talk with your provider about getting a different one. Some common types of masks include: Full face masks, which fit over the mouth and nose. Nasal masks, which fit over the nose. Nasal pillow or prong masks, which fit into the nostrils. The mask needs to be snug to your face, so some people feel trapped or closed in at first. If you feel this way, you may need to get used to the mask. Hold the mask loosely over your nose or mouth and then gradually put the the mask on more snugly. Slowly increase the amount of time you use the mask. If you have trouble with your mask not fitting well or leaking, talk with your provider. Do not stop using the mask. Tips for using the device Follow instructions from your provider about how to and how often to use the device. For home use, CPAP and BIPAP devices come from home health care companies. There are many different brands. Your health insurance company will help to decide which device you get. Keep the CPAP or BIPAP device and attachments clean. Ask your home health care company or check the instruction book for cleaning instructions. Make sure the humidifier is filled with germ-free (sterile) water and is working correctly. This will help prevent a dry or stuffy nose or nosebleeds. A nasal saline mist or spray may keep your nose from getting dry and sore. Do not eat or drink while the CPAP or BIPAP device is on. Food or drinks could get pushed into your lungs by the pressure of the CPAP or BIPAP. Follow these instructions at home: Take over-the-counter and prescription medicines only as told by your provider. Do not smoke, vape, or use nicotine or tobacco. Contact a health care provider if: You have redness or  pressure sores on your head, face, mouth, or nose from the mask or headgear. You have trouble using the CPAP or BIPAP device. You have trouble going to sleep or staying asleep. Someone tells you that you snore even when wearing your CPAP or BIPAP device. Get help right away if: You have trouble breathing. You feel confused. These symptoms may be an emergency. Get help right away. Call 911. Do not wait to see if the symptoms will go away. Do not drive yourself to the hospital. This information is not intended to replace advice given to you by your health care provider. Make sure you discuss any questions you have with your health care provider. Document Revised: 06/26/2022 Document Reviewed: 06/26/2022 Elsevier Patient Education  2024 [employer].

## 2023-11-04 ENCOUNTER — Telehealth: Payer: Self-pay

## 2023-11-04 NOTE — Telephone Encounter (Signed)
 Received a paper from [facility] supply for pt requesting LOV notes before a sleep study was completed. This has been printed and ready to be faxed. NFN

## 2023-11-25 ENCOUNTER — Ambulatory Visit: Admission: EM | Admit: 2023-11-25 | Discharge: 2023-11-25 | Disposition: A | Discharge status: Home / Self Care

## 2023-11-25 ENCOUNTER — Ambulatory Visit (INDEPENDENT_AMBULATORY_CARE_PROVIDER_SITE_OTHER)

## 2023-11-25 DIAGNOSIS — M25461 Effusion, right knee: Secondary | ICD-10-CM | POA: Diagnosis not present

## 2023-11-25 DIAGNOSIS — M25561 Pain in right knee: Secondary | ICD-10-CM | POA: Diagnosis not present

## 2023-11-25 DIAGNOSIS — M1711 Unilateral primary osteoarthritis, right knee: Secondary | ICD-10-CM

## 2023-11-25 MED ORDER — PREDNISONE 20 MG PO TABS: 40.0000 mg | ORAL_TABLET | Freq: Every day | ORAL | 0 refills | Status: AC

## 2023-11-25 MED ORDER — DICLOFENAC SODIUM 50 MG PO TBEC: 50.0000 mg | DELAYED_RELEASE_TABLET | Freq: Two times a day (BID) | ORAL | 1 refills | Status: AC

## 2023-11-25 NOTE — ED Provider Notes (Signed)
 [facility]   Note:  This document was prepared using Dragon voice recognition software and may include unintentional dictation errors.  MRN: [id number] DOB: 01-14-1956  Subjective:   Glenda White is a 68 y.o. female presenting for right knee pain, swelling x 3 days.  Patient reports she has past history of gout, has never had gout in her knee but believes that it may be a flare of her gout arthritis.  Patient denies any known injury.  Has been taking extended relief Tylenol  with minimal improvement.  Patient reports that she has an orthopedic surgeon that she sees for osteoarthritis and gout but Ortho is out of town until September 19.  Patient is here for evaluation to see if she can get medication to treat possible gout flare.  Patient reports increased pain with knee extension and ambulation.   Current Facility-Administered Medications:    0.9 %  sodium chloride  infusion, 500 mL, Intravenous, Once, Aneita Gwendlyn DASEN, MD  Current Outpatient Medications:    diclofenac  (VOLTAREN ) 50 MG EC tablet, Take 1 tablet (50 mg total) by mouth 2 (two) times daily., Disp: 30 tablet, Rfl: 1   predniSONE  (DELTASONE ) 20 MG tablet, Take 2 tablets (40 mg total) by mouth daily for 5 days., Disp: 10 tablet, Rfl: 0   aspirin EC 81 MG tablet, Take 81 mg by mouth daily. (Patient not taking: Reported on 10/20/2023), Disp: , Rfl:    atorvastatin (LIPITOR) 20 MG tablet, Take 20 mg by mouth every morning. , Disp: , Rfl:    diclofenac  Sodium (VOLTAREN ) 1 % GEL, as needed., Disp: , Rfl:    econazole nitrate 1 % cream, Apply 1 Application topically 2 (two) times daily., Disp: , Rfl:    ferrous sulfate 325 (65 FE) MG EC tablet, Take 325 mg by mouth once a week., Disp: , Rfl:    hydrochlorothiazide (HYDRODIURIL) 25 MG tablet, Take 0.5 tablets by mouth every morning. , Disp: , Rfl:    metFORMIN (GLUCOPHAGE) 500 MG tablet, Take 500 mg by mouth 2 (two) times daily with a meal. , Disp: , Rfl:    NIFEDICAL  XL 30 MG 24 hr tablet, Take 1 tablet by mouth every morning. , Disp: , Rfl:    potassium chloride  SA (K-DUR,KLOR-CON ) 20 MEQ tablet, Take 2 tabs by mouth every morning & 1 tab every evening, Disp: , Rfl:    pregabalin (LYRICA) 75 MG capsule, Take 75 mg by mouth 2 (two) times daily., Disp: , Rfl:    topiramate (TOPAMAX) 100 MG tablet, Take 1 tablet by mouth every evening. , Disp: , Rfl:    Turmeric (QC TUMERIC COMPLEX) 500 MG CAPS, Take by mouth., Disp: , Rfl:    Allergies  Allergen Reactions   Shrimp [Shellfish Allergy] Other (See Comments)    Causes gout    Past Medical History:  Diagnosis Date   Allergy    Arthritis    Cataract    Chronic low back pain 09/16/2014   Hyperlipidemia    Hypertension    Migraines    Obstruction of right ureteropelvic junction (UPJ) due to stone    Rotator cuff arthropathy of right shoulder    Type 2 diabetes mellitus (HCC)    Type 2 diabetes mellitus (HCC) 10/20/2023     Past Surgical History:  Procedure Laterality Date   ABDOMINAL HYSTERECTOMY  2002   w/ Unilateral Salpingo-Oophrectomy   CESAREAN SECTION  1988   CYST REMOVAL HAND  1980s   CYSTOSCOPY/URETEROSCOPY/HOLMIUM LASER/STENT PLACEMENT Right  01/01/2017   Procedure: CYSTOSCOPY/ RETROGRADE/URETEROSCOPY/HOLMIUM LASER/STENT PLACEMENT;  Surgeon: Devere Lonni Righter, MD;  Location: [facility];  Service: Urology;  Laterality: Right;  ONLY NEEDS 30 MIN FOR PROCEDURE   ROTATOR CUFF REPAIR Left 1990s   TONSILLECTOMY  child   UMBILICAL HERNIA REPAIR  1970s & 1990s    Family History  Problem Relation Age of Onset   Hypertension Mother    Cancer - Lung Mother    Ovarian cancer Mother    Hypertension Sister    Diabetes Sister    Hyperlipidemia Sister    Colon cancer Neg Hx    Liver cancer Neg Hx    Esophageal cancer Neg Hx    Pancreatic cancer Neg Hx    Rectal cancer Neg Hx    Stomach cancer Neg Hx     Social History   Tobacco Use   Smoking status: Never    Smokeless tobacco: Never  Vaping Use   Vaping status: Never Used  Substance Use Topics   Alcohol use: No   Drug use: No    ROS Refer to HPI for ROS details.  Objective:   Vitals: BP (!) 142/72 (BP Location: Right Arm)   Pulse 78   Temp 98.1 F (36.7 C) (Oral)   Resp 16   SpO2 98%   Physical Exam Vitals and nursing note reviewed.  Constitutional:      General: She is not in acute distress.    Appearance: Normal appearance. She is well-developed. She is not ill-appearing or toxic-appearing.  HENT:     Head: Normocephalic and atraumatic.  Cardiovascular:     Rate and Rhythm: Normal rate.  Pulmonary:     Effort: Pulmonary effort is normal. No respiratory distress.  Musculoskeletal:     Right knee: Swelling and bony tenderness present. No deformity, erythema or crepitus. Decreased range of motion. Tenderness present. Normal pulse.  Skin:    General: Skin is warm and dry.  Neurological:     General: No focal deficit present.     Mental Status: She is alert and oriented to person, place, and time.  Psychiatric:        Mood and Affect: Mood normal.        Behavior: Behavior normal.     Procedures  No results found for this or any previous visit (from the past 24 hours).  DG Knee Complete 4 Views Right Result Date: 11/25/2023 EXAM: 4 or more VIEW(S) XRAY OF THE RIGHT KNEE 11/25/2023 12:04:53 PM COMPARISON: None available. CLINICAL HISTORY: Right knee pain x 3 days. Denies injury. FINDINGS: BONES AND JOINTS: No acute fracture. Degenerative spurring of patella. Moderate tricompartmental joint space narrowing consistent with osteoarthritis. Chondrocalcinosis within menisci. Small joint effusion. SOFT TISSUES: The soft tissues are unremarkable. IMPRESSION: 1. Moderate tricompartmental joint space narrowing consistent with osteoarthritis. 2. Chondrocalcinosis within menisci. 3. Small joint effusion. Electronically signed by: Waddell Calk MD 11/25/2023 12:20 PM EDT RP Workstation:  [id number]     Assessment and Plan :     Discharge Instructions       1. Osteoarthritis of right knee, unspecified osteoarthritis type (Primary) - DG Knee Complete 4 Views Right x-ray completed in UC shows no acute fracture or dislocation, moderate tricompartmental osteoarthritis, chondrocalcinosis of the meniscus, small joint effusion.  - predniSONE  (DELTASONE ) 20 MG tablet; Take 2 tablets (40 mg total) by mouth daily for 5 days.  Dispense: 10 tablet; Refill: 0 - diclofenac  (VOLTAREN ) 50 MG EC tablet; Take 1 tablet (50 mg total)  by mouth 2 (two) times daily.  Dispense: 30 tablet; Refill: 1 - Take prednisone  as directed for 5 days, after finishing prednisone  if still having some discomfort you may take diclofenac  and Tylenol  as needed for pain control. - Follow-up with orthopedist as needed for evaluation and ongoing management of osteoarthritis and gout. -Continue to monitor symptoms for any change in severity if there is any escalation of current symptoms or development of new symptoms follow-up in ER for further evaluation and management.      Annahi Short B Eliav Mechling   Javayah Magaw, [place], [place] 11/25/23 1246

## 2023-11-25 NOTE — Discharge Instructions (Addendum)
  1. Osteoarthritis of right knee, unspecified osteoarthritis type (Primary) - DG Knee Complete 4 Views Right x-ray completed in UC shows no acute fracture or dislocation, moderate tricompartmental osteoarthritis, chondrocalcinosis of the meniscus, small joint effusion.  - predniSONE  (DELTASONE ) 20 MG tablet; Take 2 tablets (40 mg total) by mouth daily for 5 days.  Dispense: 10 tablet; Refill: 0 - diclofenac  (VOLTAREN ) 50 MG EC tablet; Take 1 tablet (50 mg total) by mouth 2 (two) times daily.  Dispense: 30 tablet; Refill: 1 - Take prednisone  as directed for 5 days, after finishing prednisone  if still having some discomfort you may take diclofenac  and Tylenol  as needed for pain control. - Follow-up with orthopedist as needed for evaluation and ongoing management of osteoarthritis and gout. -Continue to monitor symptoms for any change in severity if there is any escalation of current symptoms or development of new symptoms follow-up in ER for further evaluation and management.

## 2023-11-25 NOTE — ED Triage Notes (Signed)
 Pt reports she has right knee pain x 3 days   Denies injury   Took ES tylenol 

## 2023-12-16 ENCOUNTER — Ambulatory Visit: Admitting: Primary Care

## 2023-12-24 DIAGNOSIS — M79674 Pain in right toe(s): Secondary | ICD-10-CM | POA: Diagnosis not present

## 2023-12-24 DIAGNOSIS — M79671 Pain in right foot: Secondary | ICD-10-CM | POA: Diagnosis not present

## 2023-12-24 DIAGNOSIS — M79675 Pain in left toe(s): Secondary | ICD-10-CM | POA: Diagnosis not present

## 2023-12-24 DIAGNOSIS — M79672 Pain in left foot: Secondary | ICD-10-CM | POA: Diagnosis not present

## 2023-12-24 DIAGNOSIS — L110 Acquired keratosis follicularis: Secondary | ICD-10-CM | POA: Diagnosis not present

## 2023-12-24 DIAGNOSIS — E1140 Type 2 diabetes mellitus with diabetic neuropathy, unspecified: Secondary | ICD-10-CM | POA: Diagnosis not present

## 2023-12-25 DIAGNOSIS — I10 Essential (primary) hypertension: Secondary | ICD-10-CM | POA: Diagnosis not present

## 2023-12-25 DIAGNOSIS — Z23 Encounter for immunization: Secondary | ICD-10-CM | POA: Diagnosis not present

## 2023-12-25 DIAGNOSIS — G4733 Obstructive sleep apnea (adult) (pediatric): Secondary | ICD-10-CM | POA: Diagnosis not present

## 2023-12-25 DIAGNOSIS — E1149 Type 2 diabetes mellitus with other diabetic neurological complication: Secondary | ICD-10-CM | POA: Diagnosis not present

## 2023-12-25 DIAGNOSIS — E1140 Type 2 diabetes mellitus with diabetic neuropathy, unspecified: Secondary | ICD-10-CM | POA: Diagnosis not present

## 2024-01-08 NOTE — Telephone Encounter (Signed)
 nfn

## 2024-01-28 ENCOUNTER — Ambulatory Visit: Admitting: Nurse Practitioner

## 2024-01-28 ENCOUNTER — Encounter: Payer: Self-pay | Admitting: Primary Care

## 2024-01-28 ENCOUNTER — Ambulatory Visit: Admitting: Primary Care

## 2024-01-28 VITALS — BP 126/66 | HR 74 | Temp 97.3°F | Ht 60.0 in | Wt 220.4 lb

## 2024-01-28 DIAGNOSIS — G4700 Insomnia, unspecified: Secondary | ICD-10-CM

## 2024-01-28 DIAGNOSIS — G4733 Obstructive sleep apnea (adult) (pediatric): Secondary | ICD-10-CM

## 2024-01-28 DIAGNOSIS — R0602 Shortness of breath: Secondary | ICD-10-CM

## 2024-01-28 NOTE — Progress Notes (Signed)
 @Patient  ID: Glenda White, female    DOB: 21-Nov-1955, 68 y.o.   MRN: [id number]  Chief Complaint  Patient presents with   Obstructive Sleep Apnea    Referring provider: Sheryle Carwin, MD  HPI: 68 year old female, never smoked. PMH significant for HTN, type 2 diabetes.   Previous [facility] pulmonary encounter:  10/20/2023 Discussed the use of AI scribe software for clinical note transcription with the patient, who gave verbal consent to proceed.  History of Present Illness Glenda White is a 68 year old female with type 2 diabetes who presents with sleep disruption and suspected mild sleep apnea. She was referred by Dr. Sheryle for evaluation of suspected mild sleep apnea.  She experiences sleep disruption, characterized by waking up three to four hours after falling asleep and having difficulty returning to sleep. She typically goes to bed between 11 PM and 1 AM and wakes up around 3 or 4 AM. Despite these disruptions, she does not feel excessively tired during the day and does not regularly take naps, although she occasionally takes short 'power naps' of 10 to 15 minutes. Her husband notes that she snores at night.  A home sleep study conducted from June 16th to 18th was performed; the patient recalls being told by Dr. Sheryle that her oxygen level dropped about 14 or 15 times per hour, and that it might be mild sleep apnea. The actual report showed between 8 and 14 apneas per hour, with an average oxygen saturation of 99% and a minimum of 87%, and she spent less than half a minute with low oxygen levels.   She experiences shortness of breath, particularly when walking or sometimes while sitting. She has undergone an echocardiogram and a stress test, but the echocardiogram was not adequate for assessing pulmonary artery pressure.  Her current medications include metformin, which she takes twice daily for type 2 diabetes. She denies smoking and reports sleeping on her left side  consistently for 37 years. She has experienced weight fluctuations, noting a recent weight of 217 pounds, down from 221 pounds the previous week.    01/28/2024- Interim hx  Discussed the use of AI scribe software for clinical note transcription with the patient, who gave verbal consent to proceed.  History of Present Illness Glenda White is a 68 year old female who presents with follow-up regarding CPAP use and shortness of breath.  She underwent a home sleep study on June 19th, revealing mild obstructive sleep apnea with eight apneic events per hour. Symptoms included snoring and sleep disruption. She was started on an auto CPAP machine and reports sleeping 'a little better' but still wakes up early, typically going to bed around 11 PM and waking up around 4 to 4:30 AM. She does not take naps during the day and is not currently working. She uses the CPAP machine, with the last recorded use being three hours and forty-seven minutes, and the device recorded 1.3 apneic events per hour. No issues with the mask are reported.  She experiences persistent shortness of breath with exertion, such as walking or taking a bath. No history of smoking, asthma, chest tightness, wheezing, or cough. A chest x-ray in April was normal, and an echocardiogram in July could not assess pulmonary artery pressure or left ventricular diastolic parameters. She has not started an exercise regimen and reports shortness of breath even with minimal exertion, such as bending over to tie her shoes.   Allergies  Allergen Reactions   Shrimp [  Shellfish Allergy] Other (See Comments)    Causes gout    Immunization History  Administered Date(s) Administered   Fluad Quad(high Dose 65+) 12/25/2023    Past Medical History:  Diagnosis Date   Allergy    Arthritis    Cataract    Chronic low back pain 09/16/2014   Hyperlipidemia    Hypertension    Migraines    Obstruction of right ureteropelvic junction (UPJ) due to  stone    Rotator cuff arthropathy of right shoulder    Type 2 diabetes mellitus (HCC)    Type 2 diabetes mellitus (HCC) 10/20/2023    Tobacco History: Social History   Tobacco Use  Smoking Status Never  Smokeless Tobacco Never   Counseling given: Not Answered   Outpatient Medications Prior to Visit  Medication Sig Dispense Refill   atorvastatin (LIPITOR) 20 MG tablet Take 20 mg by mouth every morning.      diclofenac  (VOLTAREN ) 50 MG EC tablet Take 1 tablet (50 mg total) by mouth 2 (two) times daily. 30 tablet 1   diclofenac  Sodium (VOLTAREN ) 1 % GEL as needed.     econazole nitrate 1 % cream Apply 1 Application topically 2 (two) times daily.     ferrous sulfate 325 (65 FE) MG EC tablet Take 325 mg by mouth once a week.     hydrochlorothiazide (HYDRODIURIL) 25 MG tablet Take 0.5 tablets by mouth every morning.      metFORMIN (GLUCOPHAGE) 500 MG tablet Take 500 mg by mouth 2 (two) times daily with a meal.      NIFEDICAL XL 30 MG 24 hr tablet Take 1 tablet by mouth every morning.      potassium chloride  SA (K-DUR,KLOR-CON ) 20 MEQ tablet Take 2 tabs by mouth every morning & 1 tab every evening     pregabalin (LYRICA) 75 MG capsule Take 75 mg by mouth 2 (two) times daily.     topiramate (TOPAMAX) 100 MG tablet Take 1 tablet by mouth every evening.      Turmeric (QC TUMERIC COMPLEX) 500 MG CAPS Take by mouth.     aspirin EC 81 MG tablet Take 81 mg by mouth daily. (Patient not taking: Reported on 01/28/2024)     Facility-Administered Medications Prior to Visit  Medication Dose Route Frequency Provider Last Rate Last Admin   0.9 %  sodium chloride  infusion  500 mL Intravenous Once Aneita Gwendlyn DASEN, MD       Review of Systems  Review of Systems  Constitutional: Negative.   Respiratory:  Positive for shortness of breath. Negative for cough, chest tightness and wheezing.    Physical Exam  BP 126/66   Pulse 74   Temp (!) 97.3 F (36.3 C)   Ht 5' (1.524 m)   Wt 220 lb 6.4 oz (100  kg)   SpO2 98% Comment: ra  BMI 43.04 kg/m  Physical Exam Constitutional:      Appearance: Normal appearance. She is well-developed.  HENT:     Head: Normocephalic and atraumatic.     Mouth/Throat:     Mouth: Mucous membranes are moist.     Pharynx: Oropharynx is clear.  Eyes:     Pupils: Pupils are equal, round, and reactive to light.  Cardiovascular:     Rate and Rhythm: Normal rate and regular rhythm.     Heart sounds: Normal heart sounds. No murmur heard. Pulmonary:     Effort: Pulmonary effort is normal. No respiratory distress.     Breath sounds:  Normal breath sounds. No wheezing or rhonchi.  Musculoskeletal:        General: Normal range of motion.     Cervical back: Normal range of motion and neck supple.  Skin:    General: Skin is warm and dry.     Findings: No erythema or rash.  Neurological:     General: No focal deficit present.     Mental Status: She is alert and oriented to person, place, and time. Mental status is at baseline.  Psychiatric:        Mood and Affect: Mood normal.        Behavior: Behavior normal.        Thought Content: Thought content normal.        Judgment: Judgment normal.      Lab Results:  CBC    Component Value Date/Time   WBC 8.8 06/24/2017 2203   RBC 4.27 06/24/2017 2203   HGB 12.6 06/24/2017 2203   HCT 38.8 06/24/2017 2203   PLT 215 06/24/2017 2203   MCV 90.9 06/24/2017 2203   MCH 29.5 06/24/2017 2203   MCHC 32.5 06/24/2017 2203   RDW 14.7 06/24/2017 2203   LYMPHSABS 2.2 06/24/2017 2203   MONOABS 0.7 06/24/2017 2203   EOSABS 0.1 06/24/2017 2203   BASOSABS 0.0 06/24/2017 2203    BMET    Component Value Date/Time   NA 142 06/24/2017 2203   K 3.4 (L) 06/24/2017 2203   CL 107 06/24/2017 2203   CO2 23 06/24/2017 2203   GLUCOSE 80 06/24/2017 2203   BUN 12 06/24/2017 2203   CREATININE 0.81 06/24/2017 2203   CALCIUM 9.6 06/24/2017 2203   GFRNONAA >60 06/24/2017 2203   GFRAA >60 06/24/2017 2203    BNP No results  found for: BNP  ProBNP No results found for: PROBNP  Imaging: No results found.   Assessment & Plan:   1. Shortness of breath (Primary) - Pulmonary Function Test; Future  2. OSA (obstructive sleep apnea)  Assessment and Plan Assessment & Plan Obstructive sleep apnea managed with CPAP therapy Mild obstructive sleep apnea diagnosed via home sleep study on June 19th, 2025, with 8 apneic events per hour. Patient is 83% compliant with usage >4 hours over the last 30 days. Currently using auto CPAP with improvement in sleep quality and snoring. Pressure settings 5-15cm h20; AHI 1.1/hour. - Continue CPAP therapy nightly  Insomnia with difficulty maintaining sleep Reports improvement in sleep quality but continues to experience difficulty maintaining sleep, waking up after 3-4 hours. Prefers not to take medication for insomnia. - Continue current management without medication.  Exertional dyspnea under evaluation Reports shortness of breath with exertion, such as walking or bending over. No history of smoking or asthma. Previous chest x-ray in April was normal. Echocardiogram in July was inconclusive, unable to assess pulmonary artery pressure and left ventricular diastolic parameters. Lungs are clear on examination, and no wheezing or cough reported. - Ordered pulmonary function test to assess lung function. - Scheduled follow-up with pulmonologist/sleep specialist, Dr. Theodoro for consult - Encouraged cardiovascular exercise 2-3 times a week for 20-30 minutes to improve exercise tolerance.    Glenda LELON Ferrari, NP 01/28/2024

## 2024-01-28 NOTE — Patient Instructions (Addendum)
  VISIT SUMMARY: Today, we discussed your ongoing use of the CPAP machine for your mild obstructive sleep apnea and your persistent shortness of breath with exertion. You reported some improvement in your sleep quality but still experience difficulty maintaining sleep and waking up early. We also reviewed your shortness of breath, which occurs even with minimal exertion.  YOUR PLAN: -OBSTRUCTIVE SLEEP APNEA: Obstructive sleep apnea is a condition where your breathing stops and starts repeatedly during sleep due to blocked airways. You are using a CPAP machine, which is helping to control your apnea events. Please continue using the CPAP machine as directed.  -INSOMNIA WITH DIFFICULTY MAINTAINING SLEEP: Insomnia is a condition where you have trouble falling or staying asleep. You are experiencing difficulty maintaining sleep and waking up early. We will continue with your current management plan without medication.  -EXERTIONAL DYSPNEA: Exertional dyspnea is shortness of breath that occurs with physical activity. We are evaluating the cause, which could include conditions like pulmonary hypertension, heart failure, asthma, COPD, or deconditioning. We have ordered a pulmonary function test to assess your lung function and scheduled a follow-up with a pulmonologist. Additionally, you are encouraged to engage in cardiovascular exercise 2-3 times a week for 20-30 minutes to improve your exercise tolerance.  INSTRUCTIONS: Please continue using your CPAP machine as directed. We have ordered a pulmonary function test to assess your lung function. You have a follow-up appointment scheduled with Dr. Theodoro, one of our pulmonologist, for consult after your breathing test for shortness of breath. Additionally, try to engage in cardiovascular exercise 2-3 times a week for 20-30 minutes to help improve your exercise tolerance.   Follow-up Please schedule visit with Dr. Pawar- 30 min pulm consult / PFTs prior

## 2024-03-25 ENCOUNTER — Ambulatory Visit: Admitting: Primary Care

## 2024-03-25 ENCOUNTER — Encounter: Payer: Self-pay | Admitting: Primary Care

## 2024-03-25 ENCOUNTER — Ambulatory Visit: Admitting: *Deleted

## 2024-03-25 VITALS — BP 128/66 | HR 90 | Temp 97.4°F | Ht 62.0 in | Wt 217.0 lb

## 2024-03-25 DIAGNOSIS — R0609 Other forms of dyspnea: Secondary | ICD-10-CM | POA: Diagnosis not present

## 2024-03-25 DIAGNOSIS — G4733 Obstructive sleep apnea (adult) (pediatric): Secondary | ICD-10-CM | POA: Diagnosis not present

## 2024-03-25 DIAGNOSIS — R0602 Shortness of breath: Secondary | ICD-10-CM

## 2024-03-25 LAB — PULMONARY FUNCTION TEST
DL/VA % pred: 139 %
DL/VA: 5.86 ml/min/mmHg/L
DLCO cor % pred: 101 %
DLCO cor: 18.57 ml/min/mmHg
DLCO unc % pred: 101 %
DLCO unc: 18.57 ml/min/mmHg
FEF 25-75 Post: 1.45 L/s
FEF 25-75 Pre: 1.84 L/s
FEF2575-%Change-Post: -20 %
FEF2575-%Pred-Post: 77 %
FEF2575-%Pred-Pre: 97 %
FEV1-%Change-Post: -4 %
FEV1-%Pred-Post: 75 %
FEV1-%Pred-Pre: 78 %
FEV1-Post: 1.61 L
FEV1-Pre: 1.69 L
FEV1FVC-%Change-Post: 0 %
FEV1FVC-%Pred-Pre: 108 %
FEV6-%Change-Post: -3 %
FEV6-%Pred-Post: 72 %
FEV6-%Pred-Pre: 75 %
FEV6-Post: 1.96 L
FEV6-Pre: 2.03 L
FEV6FVC-%Change-Post: 0 %
FEV6FVC-%Pred-Post: 104 %
FEV6FVC-%Pred-Pre: 104 %
FVC-%Change-Post: -3 %
FVC-%Pred-Post: 69 %
FVC-%Pred-Pre: 72 %
FVC-Post: 1.96 L
FVC-Pre: 2.04 L
Post FEV1/FVC ratio: 82 %
Post FEV6/FVC ratio: 100 %
Pre FEV1/FVC ratio: 83 %
Pre FEV6/FVC Ratio: 100 %
RV % pred: 110 %
RV: 2.26 L
TLC % pred: 89 %
TLC: 4.27 L

## 2024-03-25 NOTE — Patient Instructions (Signed)
 Full PFT performed today.

## 2024-03-25 NOTE — Progress Notes (Signed)
 "  @Patient  ID: Glenda White, female    DOB: 09-Apr-1955, 69 y.o.   MRN: [id number]  Chief Complaint  Patient presents with   Obstructive Sleep Apnea    Cpap f/u     Referring provider: Hope Almarie LELON, NP  HPI: 70 year old female, never smoked. PMH significant for HTN, type 2 diabetes.   Previous [facility] pulmonary encounter:  10/20/2023 Discussed the use of AI scribe software for clinical note transcription with the patient, who gave verbal consent to proceed.  History of Present Illness Glenda White is a 69 year old female with type 2 diabetes who presents with sleep disruption and suspected mild sleep apnea. She was referred by Dr. Sheryle for evaluation of suspected mild sleep apnea.  She experiences sleep disruption, characterized by waking up three to four hours after falling asleep and having difficulty returning to sleep. She typically goes to bed between 11 PM and 1 AM and wakes up around 3 or 4 AM. Despite these disruptions, she does not feel excessively tired during the day and does not regularly take naps, although she occasionally takes short 'power naps' of 10 to 15 minutes. Her husband notes that she snores at night.  A home sleep study conducted from June 16th to 18th was performed; the patient recalls being told by Dr. Sheryle that her oxygen level dropped about 14 or 15 times per hour, and that it might be mild sleep apnea. The actual report showed between 8 and 14 apneas per hour, with an average oxygen saturation of 99% and a minimum of 87%, and she spent less than half a minute with low oxygen levels.   She experiences shortness of breath, particularly when walking or sometimes while sitting. She has undergone an echocardiogram and a stress test, but the echocardiogram was not adequate for assessing pulmonary artery pressure. Dyspnea not explained by echo findings, overall normal except for small pericardial effusion which no monitoring is required.   Her  current medications include metformin, which she takes twice White for type 2 diabetes. She denies smoking and reports sleeping on her left side consistently for 37 years. She has experienced weight fluctuations, noting a recent weight of 217 pounds, down from 221 pounds the previous week.    01/28/2024 Discussed the use of AI scribe software for clinical note transcription with the patient, who gave verbal consent to proceed.  History of Present Illness Glenda White is a 69 year old female who presents with follow-up regarding CPAP use and shortness of breath.  She underwent a home sleep study on June 19th, revealing mild obstructive sleep apnea with eight apneic events per hour. Symptoms included snoring and sleep disruption. She was started on an auto CPAP machine and reports sleeping 'a little better' but still wakes up early, typically going to bed around 11 PM and waking up around 4 to 4:30 AM. She does not take naps during the day and is not currently working. She uses the CPAP machine, with the last recorded use being three hours and forty-seven minutes, and the device recorded 1.3 apneic events per hour. No issues with the mask are reported.  She experiences persistent shortness of breath with exertion, such as walking or taking a bath. No history of smoking, asthma, chest tightness, wheezing, or cough. A chest x-ray in April was normal, and an echocardiogram in July could not assess pulmonary artery pressure or left ventricular diastolic parameters. She has not started an exercise regimen and reports  shortness of breath even with minimal exertion, such as bending over to tie her shoes.  03/25/2024- Interim hx  Discussed the use of AI scribe software for clinical note transcription with the patient, who gave verbal consent to proceed.  History of Present Illness Glenda White is a 69 year old female who presents for follow-up regarding her CPAP therapy and evaluation of  exertional shortness of breath.  She uses her CPAP machine consistently, averaging six hours and forty-one minutes per night. Her apnea-hypopnea index (AHI) was eight events per hour during her sleep study in June 2025 and is 1.4 events per hour with CPAP use. Her sleep quality has improved, although she still experiences some awakenings.  She experiences shortness of breath with exertion, such as walking or bending over. She has no history of smoking or asthma. A chest X-ray in April 2026 was normal, and an echocardiogram in July 2026 was inconclusive regarding pulmonary artery pressure and left ventricular diastolic parameters. A recent pulmonary function test showed normal diffusion capacity and total lung capacity. There was no bronchodilator response after albuterol  administration.  She is currently on metformin for diabetes management and has lost approximately four pounds recently. She is also on hydrochlorothiazide for blood pressure management. Her stress test showed an elevated blood pressure response with exercise.  Pulmonary function testing 03/26/23>> FVC 1.96 (69%), FEV1 1.61 (75%), ratio 82, DLCOunc 18.30 (101)       Allergies[1]  Immunization History  Administered Date(s) Administered   Fluad Quad(high Dose 65+) 12/25/2023    Past Medical History:  Diagnosis Date   Allergy    Arthritis    Cataract    Chronic low back pain 09/16/2014   Hyperlipidemia    Hypertension    Migraines    Obstruction of right ureteropelvic junction (UPJ) due to stone    Rotator cuff arthropathy of right shoulder    Type 2 diabetes mellitus (HCC)    Type 2 diabetes mellitus (HCC) 10/20/2023    Tobacco History: Tobacco Use History[2] Counseling given: Not Answered   Outpatient Medications Prior to Visit  Medication Sig Dispense Refill   aspirin EC 81 MG tablet Take 81 mg by mouth White. (Patient not taking: Reported on 01/28/2024)     atorvastatin (LIPITOR) 20 MG tablet Take 20 mg by  mouth every morning.      diclofenac  (VOLTAREN ) 50 MG EC tablet Take 1 tablet (50 mg total) by mouth 2 (two) times White. 30 tablet 1   diclofenac  Sodium (VOLTAREN ) 1 % GEL as needed.     econazole nitrate 1 % cream Apply 1 Application topically 2 (two) times White.     ferrous sulfate 325 (65 FE) MG EC tablet Take 325 mg by mouth once a week.     hydrochlorothiazide (HYDRODIURIL) 25 MG tablet Take 0.5 tablets by mouth every morning.      metFORMIN (GLUCOPHAGE) 500 MG tablet Take 500 mg by mouth 2 (two) times White with a meal.      NIFEDICAL XL 30 MG 24 hr tablet Take 1 tablet by mouth every morning.      potassium chloride  SA (K-DUR,KLOR-CON ) 20 MEQ tablet Take 2 tabs by mouth every morning & 1 tab every evening     pregabalin (LYRICA) 75 MG capsule Take 75 mg by mouth 2 (two) times White.     topiramate (TOPAMAX) 100 MG tablet Take 1 tablet by mouth every evening.      Turmeric (QC TUMERIC COMPLEX) 500 MG CAPS Take  by mouth.     Facility-Administered Medications Prior to Visit  Medication Dose Route Frequency Provider Last Rate Last Admin   0.9 %  sodium chloride  infusion  500 mL Intravenous Once Aneita Gwendlyn DASEN, MD       Review of Systems  Review of Systems  Constitutional: Negative.   Respiratory: Negative.     Physical Exam  There were no vitals taken for this visit. Physical Exam Constitutional:      Appearance: Normal appearance. She is well-developed.  HENT:     Head: Normocephalic and atraumatic.     Mouth/Throat:     Mouth: Mucous membranes are moist.     Pharynx: Oropharynx is clear.  Eyes:     Pupils: Pupils are equal, round, and reactive to light.  Cardiovascular:     Rate and Rhythm: Normal rate and regular rhythm.     Heart sounds: Normal heart sounds. No murmur heard. Pulmonary:     Effort: Pulmonary effort is normal. No respiratory distress.     Breath sounds: Normal breath sounds. No wheezing or rhonchi.  Musculoskeletal:        General: Normal range of  motion.     Cervical back: Normal range of motion and neck supple.  Skin:    General: Skin is warm and dry.     Findings: No erythema or rash.  Neurological:     General: No focal deficit present.     Mental Status: She is alert and oriented to person, place, and time. Mental status is at baseline.  Psychiatric:        Mood and Affect: Mood normal.        Behavior: Behavior normal.        Thought Content: Thought content normal.        Judgment: Judgment normal.     Lab Results:  CBC    Component Value Date/Time   WBC 8.8 06/24/2017 2203   RBC 4.27 06/24/2017 2203   HGB 12.6 06/24/2017 2203   HCT 38.8 06/24/2017 2203   PLT 215 06/24/2017 2203   MCV 90.9 06/24/2017 2203   MCH 29.5 06/24/2017 2203   MCHC 32.5 06/24/2017 2203   RDW 14.7 06/24/2017 2203   LYMPHSABS 2.2 06/24/2017 2203   MONOABS 0.7 06/24/2017 2203   EOSABS 0.1 06/24/2017 2203   BASOSABS 0.0 06/24/2017 2203    BMET    Component Value Date/Time   NA 142 06/24/2017 2203   K 3.4 (L) 06/24/2017 2203   CL 107 06/24/2017 2203   CO2 23 06/24/2017 2203   GLUCOSE 80 06/24/2017 2203   BUN 12 06/24/2017 2203   CREATININE 0.81 06/24/2017 2203   CALCIUM 9.6 06/24/2017 2203   GFRNONAA >60 06/24/2017 2203   GFRAA >60 06/24/2017 2203    BNP No results found for: BNP  ProBNP No results found for: PROBNP  Imaging: No results found.   Assessment & Plan:   1. OSA (obstructive sleep apnea) (Primary)  2. DOE (dyspnea on exertion) \ Assessment and Plan Assessment & Plan Mild obstructive sleep apnea Well controlled with CPAP therapy. Usage is consistent at 87% of nights for more than four hours, with an average usage of six hours and forty-one minutes. Current pressure 5-15cm h20; Apnea score is 1.4, indicating effective management. Previous sleep study showed eight events per hour, now reduced to less than 1.5 events per hour with CPAP. - Continue CPAP therapy nightly. - Change CPAP supplies routinely:  mask cushion monthly, filter monthly, tubing  every three months, headgear and water chamber every six months.  Exertional dyspnea No underlying pulmonary cause identified. Chest x-ray and breathing tests are normal. Mild restriction noted, possibly due to weight or anatomical factors. No obstructive lung disease or bronchodilator response, ruling out COPD. No clinical features of asthma. Shortness of breath may be related to weight or deconditioning. - Engage in cardiovascular exercise 3-4 times a week for 20-30 minutes. - Consider weight loss strategies, including discussing GLP medications with primary care if needed. - Monitor blood pressure and manage hypertension with primary care.  Almarie White Ferrari, NP 03/25/2024     [1]  Allergies Allergen Reactions   Shrimp [Shellfish Allergy] Other (See Comments)    Causes gout  [2]  Social History Tobacco Use  Smoking Status Never  Smokeless Tobacco Never   "

## 2024-03-25 NOTE — Patient Instructions (Signed)
" °  VISIT SUMMARY: Today, we reviewed your CPAP therapy and discussed your shortness of breath with exertion. Your CPAP usage is consistent and effective, significantly improving your sleep quality. We also evaluated your shortness of breath, and no underlying lung issues were found. We discussed potential causes and strategies to manage this symptom.  YOUR PLAN: -OBSTRUCTIVE SLEEP APNEA: Obstructive sleep apnea is a condition where your breathing stops and starts during sleep due to blocked airways. Your CPAP therapy is working well, reducing your apnea events from eight per hour to 1.4 per hour. Continue using your CPAP machine every night and change the supplies regularly: mask cushion and filter monthly, tubing every three months, and headgear and water chamber every six months.  -EXERTIONAL DYSPNEA: Exertional dyspnea means experiencing shortness of breath during physical activities. Your tests show no lung disease, so this may be related to weight or fitness level. Engage in cardiovascular exercise 3-4 times a week for 20-30 minutes, consider weight loss strategies, and monitor your blood pressure with your primary care doctor.  INSTRUCTIONS: Continue using your CPAP machine nightly and follow the schedule for changing supplies. Engage in regular cardiovascular exercise and consider weight loss strategies. Monitor your blood pressure and manage it with your primary care doctor.   "

## 2024-03-25 NOTE — Progress Notes (Signed)
 Full PFT performed today.

## 2024-03-26 ENCOUNTER — Other Ambulatory Visit (HOSPITAL_COMMUNITY): Payer: Self-pay | Admitting: Internal Medicine

## 2024-03-26 DIAGNOSIS — Z1231 Encounter for screening mammogram for malignant neoplasm of breast: Secondary | ICD-10-CM

## 2024-04-26 ENCOUNTER — Ambulatory Visit (HOSPITAL_COMMUNITY)
